# Patient Record
Sex: Female | Born: 1990 | Race: Black or African American | Hispanic: No | Marital: Single | Smoking: Never smoker
Health system: Southern US, Community
[De-identification: ages and names within clinical notes are randomized; demographics above are authoritative.]

## PROBLEM LIST (undated history)

## (undated) ENCOUNTER — Inpatient Hospital Stay (HOSPITAL_COMMUNITY): Payer: Self-pay

## (undated) DIAGNOSIS — Z789 Other specified health status: Secondary | ICD-10-CM

## (undated) DIAGNOSIS — Z8759 Personal history of other complications of pregnancy, childbirth and the puerperium: Secondary | ICD-10-CM

## (undated) HISTORY — PX: WISDOM TOOTH EXTRACTION: SHX21

---

## 2011-01-30 DIAGNOSIS — Z8759 Personal history of other complications of pregnancy, childbirth and the puerperium: Secondary | ICD-10-CM

## 2011-01-30 HISTORY — DX: Personal history of other complications of pregnancy, childbirth and the puerperium: Z87.59

## 2011-01-30 NOTE — L&D Delivery Note (Addendum)
Delivery Note The patient progressed to complete dilation and pushed well.  At 5:09 AM a non-viable female was delivered via Vaginal, Spontaneous Delivery (Presentation: Right Occiput Anterior).  APGAR:0 ,0 ; weight pending   Placenta status: Intact, Spontaneous.  Cord:  with the following complications: None. Placenta was delivered and inspected with no obvious abnormalities, cord appeared normal except blood was congealed in it.  Anesthesia: Epidural  Episiotomy: None Lacerations: nnone Suture Repair: n/a Est. Blood Loss (mL): 300  Mom to postpartum.  Baby to pathology. Emotional support given to family over loss.  D/w her no obvious cause for stillbirth at delivery.  D/w pt process of autopsy should she want one.   Will recheck platelets prior to removing epidural.  Also will get TORCH titres and antiphospholipid labs.  Oliver Pila 08/11/2011, 5:52 AM

## 2011-02-27 LAB — OB RESULTS CONSOLE ABO/RH: RH Type: POSITIVE

## 2011-02-27 LAB — OB RESULTS CONSOLE ANTIBODY SCREEN: Antibody Screen: NEGATIVE

## 2011-02-27 LAB — OB RESULTS CONSOLE HIV ANTIBODY (ROUTINE TESTING): HIV: NONREACTIVE

## 2011-06-18 LAB — OB RESULTS CONSOLE RPR: RPR: NONREACTIVE

## 2011-08-02 ENCOUNTER — Encounter (HOSPITAL_COMMUNITY): Payer: Self-pay

## 2011-08-02 ENCOUNTER — Inpatient Hospital Stay (HOSPITAL_COMMUNITY)
Admission: AD | Admit: 2011-08-02 | Discharge: 2011-08-02 | Disposition: A | Discharge status: Home / Self Care | Payer: Medicaid Other | Source: Ambulatory Visit | Attending: Obstetrics and Gynecology | Admitting: Obstetrics and Gynecology

## 2011-08-02 DIAGNOSIS — O26899 Other specified pregnancy related conditions, unspecified trimester: Secondary | ICD-10-CM

## 2011-08-02 DIAGNOSIS — O99891 Other specified diseases and conditions complicating pregnancy: Secondary | ICD-10-CM | POA: Insufficient documentation

## 2011-08-02 DIAGNOSIS — N898 Other specified noninflammatory disorders of vagina: Secondary | ICD-10-CM

## 2011-08-02 HISTORY — DX: Other specified health status: Z78.9

## 2011-08-02 LAB — WET PREP, GENITAL
Clue Cells Wet Prep HPF POC: NONE SEEN
Trich, Wet Prep: NONE SEEN
Yeast Wet Prep HPF POC: NONE SEEN

## 2011-08-02 LAB — URINALYSIS, ROUTINE W REFLEX MICROSCOPIC
Bilirubin Urine: NEGATIVE
Hgb urine dipstick: NEGATIVE
Ketones, ur: NEGATIVE mg/dL
Protein, ur: NEGATIVE mg/dL
Specific Gravity, Urine: 1.02 (ref 1.005–1.030)
Urobilinogen, UA: 0.2 mg/dL (ref 0.0–1.0)

## 2011-08-02 LAB — AMNISURE RUPTURE OF MEMBRANE (ROM) NOT AT ARMC: Amnisure ROM: NEGATIVE

## 2011-08-02 NOTE — MAU Note (Signed)
Pt states was last seen by Dr. Ambrose Mantle, told her baby was measuring small for dating. Was seen yesterday. Notes clear watery vaginal discharge x1-2 weeks. Denies bleeding.

## 2011-08-02 NOTE — MAU Note (Signed)
Patient states she has been having a thick discharge for a couple of weeks but has not become watery. Reports fetal movement, not as much as usual, no bleeding. Some mild tightening off and on.

## 2011-08-02 NOTE — MAU Provider Note (Signed)
  History     CSN: [id number]  Arrival date and time: 08/02/11 1517   First Provider Initiated Contact with Patient 08/02/11 1600      Chief Complaint  Patient presents with  . Rupture of Membranes   HPI Kayla Moon 21 y.o. is a G1P0 [redacted]w[redacted]d.  She has  had vaginal discharge off/on x 2-3 wks, sometimes is thin , some time is not. Is concerned may have ROM. No bleeding, good fetal movement. Occ cramping.     Past Medical History  Diagnosis Date  . No pertinent past medical history     Past Surgical History  Procedure Date  . Wisdom tooth extraction     Family History  Problem Relation Age of Onset  . Other Neg Hx     History  Substance Use Topics  . Smoking status: Never Smoker   . Smokeless tobacco: Never Used  . Alcohol Use: No    Allergies: No Known Allergies  Prescriptions prior to admission  Medication Sig Dispense Refill  . Prenatal Vit-Fe Fumarate-FA (PRENATAL MULTIVITAMIN) TABS Take 1 tablet by mouth daily.        Review of Systems  Constitutional: Negative for fever and chills.  Genitourinary: Negative for dysuria, urgency and frequency.       Vaginal discharge, mild cramping   Physical Exam   Blood pressure 114/59, pulse 79, temperature 99 F (37.2 C), temperature source Oral, resp. rate 16, height 5\' 4"  (1.626 m), weight 169 lb 3.2 oz (76.749 kg), SpO2 98.00%.  Physical Exam  Constitutional: She is oriented to person, place, and time. She appears well-developed and well-nourished.  GI: Soft. There is no tenderness. There is no guarding.  Genitourinary:       Vulva- nl anatomy Vagina- small amt thick white and mucoid discharge Cx- posterior, thick, firm, closed Uterus- gravid   Musculoskeletal: Normal range of motion.  Neurological: She is alert and oriented to person, place, and time.  Skin: Skin is warm and dry.  Psychiatric: She has a normal mood and affect. Her behavior is normal.    MAU Course  Procedures  MDM Results for orders  placed during the hospital encounter of 08/02/11 (from the past 24 hour(s))  WET PREP, GENITAL     Status: Abnormal   Collection Time   08/02/11  4:09 PM      Component Value Range   Yeast Wet Prep HPF POC NONE SEEN  NONE SEEN   Trich, Wet Prep NONE SEEN  NONE SEEN   Clue Cells Wet Prep HPF POC NONE SEEN  NONE SEEN   WBC, Wet Prep HPF POC FEW (*) NONE SEEN  AMNISURE RUPTURE OF MEMBRANE (ROM)     Status: Normal   Collection Time   08/02/11  4:09 PM      Component Value Range   Amnisure ROM NEGATIVE     Consulted with Dr Senaida Ores  Assessment and Plan  Fern negative, Amniosure negative Cx closed, thick  Reactive strip.   D/C home Keep next appt in office Precautions reviewed  Rodell Perna. 08/02/2011, 5:28 PM

## 2011-08-03 ENCOUNTER — Other Ambulatory Visit (HOSPITAL_COMMUNITY): Payer: Self-pay | Admitting: Obstetrics and Gynecology

## 2011-08-03 DIAGNOSIS — Z0489 Encounter for examination and observation for other specified reasons: Secondary | ICD-10-CM

## 2011-08-06 ENCOUNTER — Ambulatory Visit (HOSPITAL_COMMUNITY): Admission: RE | Admit: 2011-08-06 | Payer: Self-pay | Source: Ambulatory Visit

## 2011-08-07 ENCOUNTER — Ambulatory Visit (HOSPITAL_COMMUNITY)
Admission: RE | Admit: 2011-08-07 | Discharge: 2011-08-07 | Disposition: A | Discharge status: Home / Self Care | Payer: Medicaid Other | Source: Ambulatory Visit | Attending: Obstetrics and Gynecology | Admitting: Obstetrics and Gynecology

## 2011-08-07 DIAGNOSIS — Z3689 Encounter for other specified antenatal screening: Secondary | ICD-10-CM | POA: Insufficient documentation

## 2011-08-07 DIAGNOSIS — Z0489 Encounter for examination and observation for other specified reasons: Secondary | ICD-10-CM

## 2011-08-07 DIAGNOSIS — O365990 Maternal care for other known or suspected poor fetal growth, unspecified trimester, not applicable or unspecified: Secondary | ICD-10-CM | POA: Insufficient documentation

## 2011-08-10 ENCOUNTER — Inpatient Hospital Stay (HOSPITAL_COMMUNITY)
Admission: AD | Admit: 2011-08-10 | Discharge: 2011-08-12 | DRG: 775 | Disposition: A | Discharge status: Home / Self Care | Payer: Medicaid Other | Source: Ambulatory Visit | Attending: Obstetrics and Gynecology | Admitting: Obstetrics and Gynecology

## 2011-08-10 DIAGNOSIS — D696 Thrombocytopenia, unspecified: Secondary | ICD-10-CM | POA: Diagnosis present

## 2011-08-10 DIAGNOSIS — O9912 Other diseases of the blood and blood-forming organs and certain disorders involving the immune mechanism complicating childbirth: Secondary | ICD-10-CM | POA: Diagnosis present

## 2011-08-10 DIAGNOSIS — D689 Coagulation defect, unspecified: Secondary | ICD-10-CM | POA: Diagnosis present

## 2011-08-10 DIAGNOSIS — O364XX0 Maternal care for intrauterine death, not applicable or unspecified: Principal | ICD-10-CM | POA: Diagnosis present

## 2011-08-10 DIAGNOSIS — IMO0002 Reserved for concepts with insufficient information to code with codable children: Secondary | ICD-10-CM

## 2011-08-11 ENCOUNTER — Encounter (HOSPITAL_COMMUNITY): Payer: Self-pay

## 2011-08-11 ENCOUNTER — Inpatient Hospital Stay (HOSPITAL_COMMUNITY): Payer: Medicaid Other

## 2011-08-11 ENCOUNTER — Encounter (HOSPITAL_COMMUNITY): Payer: Self-pay | Admitting: Anesthesiology

## 2011-08-11 ENCOUNTER — Inpatient Hospital Stay (HOSPITAL_COMMUNITY): Payer: Medicaid Other | Admitting: Anesthesiology

## 2011-08-11 LAB — TYPE AND SCREEN
ABO/RH(D): O POS
Antibody Screen: NEGATIVE

## 2011-08-11 LAB — CBC
HCT: 38.5 % (ref 36.0–46.0)
Hemoglobin: 12 g/dL (ref 12.0–15.0)
Hemoglobin: 12.4 g/dL (ref 12.0–15.0)
MCH: 27.5 pg (ref 26.0–34.0)
MCHC: 32.5 g/dL (ref 30.0–36.0)
Platelets: 101 10*3/uL — ABNORMAL LOW (ref 150–400)
RBC: 4.54 MIL/uL (ref 3.87–5.11)
WBC: 12.9 10*3/uL — ABNORMAL HIGH (ref 4.0–10.5)

## 2011-08-11 LAB — COMPREHENSIVE METABOLIC PANEL
AST: 16 U/L (ref 0–37)
CO2: 21 mEq/L (ref 19–32)
Calcium: 9.8 mg/dL (ref 8.4–10.5)
Creatinine, Ser: 0.62 mg/dL (ref 0.50–1.10)
GFR calc non Af Amer: >90 mL/min (ref >90)

## 2011-08-11 LAB — RPR: RPR Ser Ql: NONREACTIVE

## 2011-08-11 MED ORDER — MISOPROSTOL 200 MCG PO TABS
ORAL_TABLET | ORAL | Status: AC
  Filled 2011-08-11: qty 6

## 2011-08-11 MED ORDER — PHENYLEPHRINE 40 MCG/ML (10ML) SYRINGE FOR IV PUSH (FOR BLOOD PRESSURE SUPPORT)
80.0000 ug | PREFILLED_SYRINGE | INTRAVENOUS | Status: DC | PRN
  Filled 2011-08-11: qty 5

## 2011-08-11 MED ORDER — WITCH HAZEL-GLYCERIN EX PADS: 1.0000 "application " | MEDICATED_PAD | CUTANEOUS | Status: DC | PRN

## 2011-08-11 MED ORDER — OXYTOCIN BOLUS FROM INFUSION
250.0000 mL | Freq: Once | INTRAVENOUS | Status: DC
  Filled 2011-08-11: qty 500

## 2011-08-11 MED ORDER — ONDANSETRON HCL 4 MG/2ML IJ SOLN: 4.0000 mg | INTRAMUSCULAR | Status: DC | PRN

## 2011-08-11 MED ORDER — TETANUS-DIPHTH-ACELL PERTUSSIS 5-2.5-18.5 LF-MCG/0.5 IM SUSP: 0.5000 mL | Freq: Once | INTRAMUSCULAR | Status: DC

## 2011-08-11 MED ORDER — ACETAMINOPHEN 325 MG PO TABS: 650.0000 mg | ORAL_TABLET | ORAL | Status: DC | PRN

## 2011-08-11 MED ORDER — ONDANSETRON HCL 4 MG PO TABS: 4.0000 mg | ORAL_TABLET | ORAL | Status: DC | PRN

## 2011-08-11 MED ORDER — CITRIC ACID-SODIUM CITRATE 334-500 MG/5ML PO SOLN: 30.0000 mL | ORAL | Status: DC | PRN

## 2011-08-11 MED ORDER — LIDOCAINE HCL (PF) 1 % IJ SOLN: 30.0000 mL | INTRAMUSCULAR | Status: DC | PRN

## 2011-08-11 MED ORDER — SIMETHICONE 80 MG PO CHEW
80.0000 mg | CHEWABLE_TABLET | ORAL | Status: DC | PRN
Start: 2011-08-11 — End: 2011-08-12

## 2011-08-11 MED ORDER — IBUPROFEN 600 MG PO TABS: 600.0000 mg | ORAL_TABLET | Freq: Four times a day (QID) | ORAL | Status: AC

## 2011-08-11 MED ORDER — DIPHENHYDRAMINE HCL 50 MG/ML IJ SOLN: 12.5000 mg | INTRAMUSCULAR | Status: DC | PRN

## 2011-08-11 MED ORDER — DIBUCAINE 1 % RE OINT: 1.0000 "application " | TOPICAL_OINTMENT | RECTAL | Status: DC | PRN

## 2011-08-11 MED ORDER — ONDANSETRON HCL 4 MG/2ML IJ SOLN: 4.0000 mg | Freq: Four times a day (QID) | INTRAMUSCULAR | Status: DC | PRN

## 2011-08-11 MED ORDER — FENTANYL 2.5 MCG/ML BUPIVACAINE 1/10 % EPIDURAL INFUSION (WH - ANES)
14.0000 mL/h | INTRAMUSCULAR | Status: DC
  Administered 2011-08-11: 14 mL/h via EPIDURAL
  Filled 2011-08-11: qty 60

## 2011-08-11 MED ORDER — TERBUTALINE SULFATE 1 MG/ML IJ SOLN: 0.2500 mg | Freq: Once | INTRAMUSCULAR | Status: DC | PRN

## 2011-08-11 MED ORDER — OXYCODONE-ACETAMINOPHEN 5-325 MG PO TABS: 1.0000 | ORAL_TABLET | ORAL | Status: DC | PRN

## 2011-08-11 MED ORDER — BENZOCAINE-MENTHOL 20-0.5 % EX AERO: 1.0000 "application " | INHALATION_SPRAY | CUTANEOUS | Status: DC | PRN

## 2011-08-11 MED ORDER — LIDOCAINE HCL (PF) 1 % IJ SOLN
INTRAMUSCULAR | Status: DC | PRN
  Administered 2011-08-11 (×2): 5 mL

## 2011-08-11 MED ORDER — EPHEDRINE 5 MG/ML INJ: 10.0000 mg | INTRAVENOUS | Status: DC | PRN

## 2011-08-11 MED ORDER — OXYCODONE-ACETAMINOPHEN 5-325 MG PO TABS: 1.0000 | ORAL_TABLET | ORAL | Status: AC | PRN

## 2011-08-11 MED ORDER — ZOLPIDEM TARTRATE 5 MG PO TABS: 5.0000 mg | ORAL_TABLET | Freq: Every evening | ORAL | Status: DC | PRN

## 2011-08-11 MED ORDER — EPHEDRINE 5 MG/ML INJ
10.0000 mg | INTRAVENOUS | Status: DC | PRN
  Filled 2011-08-11: qty 4

## 2011-08-11 MED ORDER — OXYTOCIN 40 UNITS IN LACTATED RINGERS INFUSION - SIMPLE MED
62.5000 mL/h | Freq: Once | INTRAVENOUS | Status: AC
  Administered 2011-08-11: 62.5 mL/h via INTRAVENOUS

## 2011-08-11 MED ORDER — FLEET ENEMA 7-19 GM/118ML RE ENEM: 1.0000 | ENEMA | RECTAL | Status: DC | PRN

## 2011-08-11 MED ORDER — OXYTOCIN 40 UNITS IN LACTATED RINGERS INFUSION - SIMPLE MED
1.0000 m[IU]/min | INTRAVENOUS | Status: DC
  Administered 2011-08-11: 2 m[IU]/min via INTRAVENOUS
  Filled 2011-08-11: qty 1000

## 2011-08-11 MED ORDER — LACTATED RINGERS IV SOLN: 500.0000 mL | INTRAVENOUS | Status: DC | PRN

## 2011-08-11 MED ORDER — IBUPROFEN 600 MG PO TABS
600.0000 mg | ORAL_TABLET | Freq: Four times a day (QID) | ORAL | Status: DC
  Administered 2011-08-11 (×2): 600 mg via ORAL
  Filled 2011-08-11 (×3): qty 1

## 2011-08-11 MED ORDER — LACTATED RINGERS IV SOLN
INTRAVENOUS | Status: DC
  Administered 2011-08-11: 02:00:00 via INTRAVENOUS

## 2011-08-11 MED ORDER — PRENATAL MULTIVITAMIN CH: 1.0000 | ORAL_TABLET | Freq: Every day | ORAL | Status: DC

## 2011-08-11 MED ORDER — OXYCODONE-ACETAMINOPHEN 5-325 MG PO TABS
1.0000 | ORAL_TABLET | ORAL | Status: DC | PRN
  Administered 2011-08-11 – 2011-08-12 (×3): 1 via ORAL
  Filled 2011-08-11: qty 2
  Filled 2011-08-11 (×2): qty 1

## 2011-08-11 MED ORDER — IBUPROFEN 600 MG PO TABS: 600.0000 mg | ORAL_TABLET | Freq: Four times a day (QID) | ORAL | Status: DC | PRN

## 2011-08-11 MED ORDER — METHYLERGONOVINE MALEATE 0.2 MG/ML IJ SOLN
INTRAMUSCULAR | Status: AC
  Filled 2011-08-11: qty 1

## 2011-08-11 MED ORDER — PHENYLEPHRINE 40 MCG/ML (10ML) SYRINGE FOR IV PUSH (FOR BLOOD PRESSURE SUPPORT): 80.0000 ug | PREFILLED_SYRINGE | INTRAVENOUS | Status: DC | PRN

## 2011-08-11 MED ORDER — LANOLIN HYDROUS EX OINT: TOPICAL_OINTMENT | CUTANEOUS | Status: DC | PRN

## 2011-08-11 MED ORDER — DIPHENHYDRAMINE HCL 25 MG PO CAPS: 25.0000 mg | ORAL_CAPSULE | Freq: Four times a day (QID) | ORAL | Status: DC | PRN

## 2011-08-11 MED ORDER — LACTATED RINGERS IV SOLN: 500.0000 mL | Freq: Once | INTRAVENOUS | Status: DC

## 2011-08-11 MED ORDER — SENNOSIDES-DOCUSATE SODIUM 8.6-50 MG PO TABS
2.0000 | ORAL_TABLET | Freq: Every day | ORAL | Status: DC
  Administered 2011-08-11: 2 via ORAL

## 2011-08-11 NOTE — Progress Notes (Signed)
Patient ID: Kayla Moon, female   DOB: 01-Apr-1990, 21 y.o.   MRN: [id number] Pt now comfortable with epidural  Ctx  q 5 minutes AROM clear, no blood noted or foul smell to fluid 80/7/-1  IUPC and foley placed Platelets were low normal at 101K but BP WNL and CMET WNL so suspect gestational thrombocytopenia. Will monitor progress and augment if needed, but making good progress. Baby OP currently. Pt just had [place] 7/9 that showed normal growth and AFI and no anatomical issues.

## 2011-08-11 NOTE — Discharge Summary (Addendum)
Obstetric Discharge Summary Reason for Admission: IUFD with labor Prenatal Procedures: ultrasound Intrapartum Procedures: spontaneous vaginal delivery Postpartum Procedures: TORCH titres and labs Complications-Operative and Postpartum: none Hemoglobin  Date Value Range Status  08/11/2011 12.4  12.0 - 15.0 g/dL Final     HCT  Date Value Range Status  08/11/2011 38.5  36.0 - 46.0 % Final    Physical Exam:  General: alert and cooperative, grieving appropriately Lochia: appropriate Uterine Fundus: firm   Discharge Diagnoses: Intrauterine demise at 35+weeks of unknown etiology--delivered  Discharge Information: Date: 08/11/2011 Activity: pelvic rest Diet: routine Medications: Ibuprofen and Percocet Condition: improved Instructions: refer to practice specific booklet Discharge to: home   Newborn Data: Live born female  Birth Weight: 6 lb 7 oz (2920 g) APGAR :0 , 0    Kayla Moon W 08/11/2011, 8:32 AM  Patient decided to stay an additional night to sleep. Was d/c on postpartum day 1.

## 2011-08-11 NOTE — Anesthesia Procedure Notes (Signed)
Epidural Patient location during procedure: OB Start time: 08/11/2011 2:02 AM  Staffing Anesthesiologist: Brayton Caves R Performed by: anesthesiologist   Preanesthetic Checklist Completed: patient identified, site marked, surgical consent, pre-op evaluation, timeout performed, IV checked, risks and benefits discussed and monitors and equipment checked  Epidural Patient position: sitting Prep: site prepped and draped and DuraPrep Patient monitoring: continuous pulse ox and blood pressure Approach: midline Injection technique: LOR air and LOR saline  Needle:  Needle type: Tuohy  Needle gauge: 17 G Needle length: 9 cm Needle insertion depth: 5 cm cm Catheter type: closed end flexible Catheter size: 19 Gauge Catheter at skin depth: 10 cm Test dose: negative  Assessment Events: blood not aspirated, injection not painful, no injection resistance, negative IV test and no paresthesia  Additional Notes Patient identified.  Risk benefits discussed including failed block, incomplete pain control, headache, nerve damage, paralysis, blood pressure changes, nausea, vomiting, reactions to medication both toxic or allergic, and postpartum back pain.  Patient expressed understanding and wished to proceed.  All questions were answered.  Sterile technique used throughout procedure and epidural site dressed with sterile barrier dressing. No paresthesia or other complications noted.The patient did not experience any signs of intravascular injection such as tinnitus or metallic taste in mouth nor signs of intrathecal spread such as rapid motor block. Please see nursing notes for vital signs.

## 2011-08-11 NOTE — Progress Notes (Signed)
Called susan lineberry, np stat for a bedside u/s. Unable to attain fetal heart tone

## 2011-08-11 NOTE — Clinical Social Work Note (Signed)
SW received referral from unit RN, Selena Batten, due to fetal demise at 35 weeks with no known etiology.  SW offered condolences and referred family to [facility] for bereavement counseling.  FOB was still holding baby in the room.  MOB appeared to be grieving appropriately.  She displayed a sad affect and did not wish to discuss further.  Encouraged MOB to contact SW should she need further counseling while in the hospital. Louie Boston, LCSW

## 2011-08-11 NOTE — Anesthesia Preprocedure Evaluation (Signed)
Anesthesia Evaluation  Patient identified by MRN, date of birth, ID band Patient awake    Reviewed: Allergy & Precautions, H&P , Patient's Chart, lab work & pertinent test results  Airway Mallampati: II TM Distance: >3 FB Neck ROM: full    Dental No notable dental hx.    Pulmonary neg pulmonary ROS,  breath sounds clear to auscultation  Pulmonary exam normal       Cardiovascular negative cardio ROS  Rhythm:regular Rate:Normal     Neuro/Psych negative neurological ROS  negative psych ROS   GI/Hepatic negative GI ROS, Neg liver ROS,   Endo/Other  negative endocrine ROS  Renal/GU negative Renal ROS     Musculoskeletal   Abdominal   Peds  Hematology negative hematology ROS (+)   Anesthesia Other Findings IUFD  Reproductive/Obstetrics (+) Pregnancy                           Anesthesia Physical Anesthesia Plan  ASA: II  Anesthesia Plan: Epidural   Post-op Pain Management:    Induction:   Airway Management Planned:   Additional Equipment:   Intra-op Plan:   Post-operative Plan:   Informed Consent: I have reviewed the patients History and Physical, chart, labs and discussed the procedure including the risks, benefits and alternatives for the proposed anesthesia with the patient or authorized representative who has indicated his/her understanding and acceptance.     Plan Discussed with:   Anesthesia Plan Comments:         Anesthesia Quick Evaluation

## 2011-08-11 NOTE — H&P (Signed)
Kayla Moon is a 21 y.o. female G1P0 at 28 3/7 weeks (EDD 09/12/11 by 11 week [place]) presenting for painful regular contractions with the unfortunate finding of a fetal demise on arrival.  Confirmed by [place], vtx and no obvious placental abnormalities. Prenatal care completely uneventful.  Maternal Medical History:  Reason for admission: Reason for admission: contractions.  Contractions: Onset was 6-12 hours ago.   Frequency: regular.   Perceived severity is strong.    Fetal activity: Perceived fetal activity is none.      OB History    Grav Para Term Preterm Abortions TAB SAB Ect Mult Living   1              Past Medical History  Diagnosis Date  . No pertinent past medical history    Past Surgical History  Procedure Date  . Wisdom tooth extraction    Family History: family history is negative for Other. Social History:  reports that she has never smoked. She has never used smokeless tobacco. She reports that she does not drink alcohol or use illicit drugs.   Prenatal Transfer Tool  Maternal Diabetes: No Genetic Screening: Normal First trimester screen and AFP WNL Maternal Ultrasounds/Referrals: Normal Fetal Ultrasounds or other Referrals:  None Maternal Substance Abuse:  No Significant Maternal Medications:  None Significant Maternal Lab Results:  None Other Comments:  None  ROS  Dilation: 4 Effacement (%): 90 Station: -2 Exam by:: Peace, rn Blood pressure 125/72, pulse 78, resp. rate 20, SpO2 100.00%. Maternal Exam:  Uterine Assessment: Contraction strength is firm.  Contraction frequency is regular.   Abdomen: Patient reports no abdominal tenderness. Fetal presentation: vertex  Introitus: Normal vulva. Normal vagina.    Physical Exam  Constitutional: She is oriented to person, place, and time. She appears well-developed and well-nourished.  Cardiovascular: Normal rate and regular rhythm.   Respiratory: Effort normal and breath sounds normal.  GI: Soft. Bowel  sounds are normal.  Genitourinary: Vagina normal and uterus normal.       4/90/-1 per RN exam  Musculoskeletal: Normal range of motion.  Neurological: She is alert and oriented to person, place, and time.  Psychiatric: Her behavior is normal.    Prenatal labs: ABO, Rh:  O positive  Antibody:  Negative Rubella:  Immune RPR:   NR HBsAg:   Neg HIV:   NR GBS:   unknown One hour GTT 96 First trimester screen and AFP WNL Assessment/Plan: Pt with IUFD at 35+ weeks confirmed by [place].  Unremarkable prenatal care with no obvious etiology.  Pt uncomfortable with contractions, will admit and get epidural.  Briefly d/w pt autopsy if no obvious cause present at delivery.  Oliver Pila 08/11/2011, 1:23 AM

## 2011-08-11 NOTE — Progress Notes (Signed)
Report given of confirmed IUFD by ultrasound.

## 2011-08-11 NOTE — MAU Note (Signed)
Patient is in with c/o ctx all day that got worse since 2200pm. She denies any vaginal bleeding, or lof. Reports decreased fetal movement.

## 2011-08-11 NOTE — Progress Notes (Signed)
Patient states that she have been having contraction intermittently all day. She states that the last active movement from baby was 10.30am. She states that she started more frequent contractions in the afternoon and that was the last time she felt any fetal movement. She states that about 2200, the contractions are more intense (about q41mins ) and and she had pressure.

## 2011-08-11 NOTE — Progress Notes (Signed)
Patient ID: Kayla Moon, female   DOB: 1990/09/13, 21 y.o.   MRN: [id number] Pt in postpartum room and holding baby She would like to go home this pm if stable  Fundus firm  Plts are stable at 120K D/w pt normal grieving, Heart Strings and what to expect Will d/c home this pm with motrin and percocet

## 2011-08-11 NOTE — Anesthesia Postprocedure Evaluation (Signed)
  Anesthesia Post-op Note  Patient: Kayla Moon  Procedure(s) Performed: * No procedures listed *  Patient Location: PACU and Women's Unit  Anesthesia Type: Epidural  Level of Consciousness: awake, alert  and oriented  Airway and Oxygen Therapy: Patient Spontanous Breathing    Post-op Assessment: Patient's Cardiovascular Status Stable and Respiratory Function Stable  Post-op Vital Signs: stable  Complications: No apparent anesthesia complications

## 2011-08-11 NOTE — MAU Provider Note (Signed)
  History     CSN: [id number]  Arrival date and time: 08/10/11 2358   None     Chief Complaint  Patient presents with  . Contractions   HPI  Pt is [redacted]w[redacted]d pregnant and presents with contractions and decreased fetal movement  Past Medical History  Diagnosis Date  . No pertinent past medical history     Past Surgical History  Procedure Date  . Wisdom tooth extraction     Family History  Problem Relation Age of Onset  . Other Neg Hx     History  Substance Use Topics  . Smoking status: Never Smoker   . Smokeless tobacco: Never Used  . Alcohol Use: No    Allergies: No Known Allergies  Prescriptions prior to admission  Medication Sig Dispense Refill  . Prenatal Vit-Fe Fumarate-FA (PRENATAL MULTIVITAMIN) TABS Take 1 tablet by mouth daily.        ROS Physical Exam   There were no vitals taken for this visit.  Physical Exam  MAU Course  Procedures Doppler fetal heart tones not audible- informal bedside  Showed no fetal movement- formal bedside ultrasound ordered Dr. Senaida Ores notified Radiologist called to confirm no cardiac activity Assessment and Plan    Trevaris Pennella 08/11/2011, 12:38 AM

## 2011-08-12 LAB — CBC
Hemoglobin: 11.5 g/dL — ABNORMAL LOW (ref 12.0–15.0)
MCH: 28.3 pg (ref 26.0–34.0)
MCHC: 33.1 g/dL (ref 30.0–36.0)
MCV: 85.5 fL (ref 78.0–100.0)
RBC: 4.06 MIL/uL (ref 3.87–5.11)

## 2011-08-12 LAB — TOXOPLASMA GONDII ANTIBODY, IGG: Toxoplasma IgG Ratio: <0.5 IU/mL (ref <6.4)

## 2011-08-12 NOTE — Progress Notes (Signed)
Discharge instructions reviewed with patient and significant other.  Both verbally able to teach back home care and signs/symptoms to report to MD.  No home equipment needed.  Ambulated to car with staff without incident and discharged to care of significant other.

## 2011-08-12 NOTE — Progress Notes (Signed)
Patient ID: Kayla Moon, female   DOB: 12/27/1990, 21 y.o.   MRN: [id number] Pt stayed overnight per her request and got some good rest. We will d/c home today with meds as previously planned. Spent a good deal of time talking about normal grieving and they feel they have great support with their families.

## 2011-08-13 LAB — LUPUS ANTICOAGULANT PANEL: PTT Lupus Anticoagulant: 34.3 secs (ref 28.0–43.0)

## 2011-08-13 LAB — CARDIOLIPIN ANTIBODIES, IGG, IGM, IGA
Anticardiolipin IgA: 5 APL U/mL — ABNORMAL LOW (ref <22)
Anticardiolipin IgM: 3 MPL U/mL — ABNORMAL LOW (ref <11)

## 2011-08-13 LAB — WOUND CULTURE: Culture: NO GROWTH

## 2011-08-13 LAB — HSV 1 ANTIBODY, IGG: HSV 1 Glycoprotein G Ab, IgG: 5.46 IV — ABNORMAL HIGH

## 2011-08-14 LAB — CMV ANTIBODY, IGG (EIA): CMV Ab - IgG: 6.36 — ABNORMAL HIGH (ref <0.90)

## 2011-08-15 ENCOUNTER — Encounter (HOSPITAL_COMMUNITY): Payer: Self-pay | Admitting: *Deleted

## 2011-08-16 LAB — ANAEROBIC CULTURE: Gram Stain: NONE SEEN

## 2011-08-17 LAB — TORCH-IGM(TOXO/ RUB/ CMV/ HSV) W TITER
CMV IgM: <0.2 AI
HSV 1 IgM Abs: NEGATIVE
RPR Screen: NONREACTIVE
Rubella IgM Index: <0.9 Ratio (ref <0.90)
Toxoplasma IgM: NEGATIVE

## 2011-08-23 NOTE — Progress Notes (Signed)
Post discharge UR review completed. 

## 2011-11-02 ENCOUNTER — Emergency Department (HOSPITAL_BASED_OUTPATIENT_CLINIC_OR_DEPARTMENT_OTHER)
Admission: EM | Admit: 2011-11-02 | Discharge: 2011-11-02 | Disposition: A | Discharge status: Home / Self Care | Payer: Medicaid Other | Attending: Emergency Medicine | Admitting: Emergency Medicine

## 2011-11-02 ENCOUNTER — Encounter (HOSPITAL_BASED_OUTPATIENT_CLINIC_OR_DEPARTMENT_OTHER): Payer: Self-pay

## 2011-11-02 DIAGNOSIS — M436 Torticollis: Secondary | ICD-10-CM | POA: Insufficient documentation

## 2011-11-02 MED ORDER — CYCLOBENZAPRINE HCL 10 MG PO TABS
10.0000 mg | ORAL_TABLET | Freq: Once | ORAL | Status: AC
  Administered 2011-11-02: 10 mg via ORAL
  Filled 2011-11-02: qty 1

## 2011-11-02 MED ORDER — HYDROCODONE-ACETAMINOPHEN 5-325 MG PO TABS
1.0000 | ORAL_TABLET | Freq: Once | ORAL | Status: AC
  Administered 2011-11-02: 1 via ORAL
  Filled 2011-11-02: qty 1

## 2011-11-02 MED ORDER — HYDROCODONE-ACETAMINOPHEN 5-325 MG PO TABS: 2.0000 | ORAL_TABLET | ORAL | Status: AC | PRN

## 2011-11-02 MED ORDER — CYCLOBENZAPRINE HCL 10 MG PO TABS: 10.0000 mg | ORAL_TABLET | Freq: Two times a day (BID) | ORAL | Status: DC | PRN

## 2011-11-02 NOTE — ED Provider Notes (Signed)
History     CSN: [id number]  Arrival date & time 11/02/11  1407   First MD Initiated Contact with Patient 11/02/11 1442      Chief Complaint  Patient presents with  . Neck Pain    (Consider location/radiation/quality/duration/timing/severity/associated sxs/prior treatment) HPI  Past Medical History  Diagnosis Date  . No pertinent past medical history     Past Surgical History  Procedure Date  . Wisdom tooth extraction     Family History  Problem Relation Age of Onset  . Other Neg Hx     History  Substance Use Topics  . Smoking status: Never Smoker   . Smokeless tobacco: Never Used  . Alcohol Use: No    OB History    Grav Para Term Preterm Abortions TAB SAB Ect Mult Living   1 1  1             Review of Systems  Allergies  Review of patient's allergies indicates no known allergies.  Home Medications   Current Outpatient Rx  Name Route Sig Dispense Refill  . CYCLOBENZAPRINE HCL 10 MG PO TABS Oral Take 1 tablet (10 mg total) by mouth 2 (two) times daily as needed for muscle spasms. 20 tablet 0  . HYDROCODONE-ACETAMINOPHEN 5-325 MG PO TABS Oral Take 2 tablets by mouth every 4 (four) hours as needed for pain. 16 tablet 0  . PRENATAL MULTIVITAMIN CH Oral Take 1 tablet by mouth daily.      BP 125/57  Pulse 83  Temp 98.6 F (37 C) (Oral)  Resp 20  Ht 5\' 6"  (1.676 m)  Wt 150 lb (68.04 kg)  BMI 24.21 kg/m2  SpO2 99%  LMP 09/21/2011  Physical Exam  ED Course  Procedures (including critical care time)  Labs Reviewed - No data to display No results found.   1. Torticollis, acute       MDM  Pt reports her neck feels better,  Increased range of motion,   Pt given rx for hydrocodone and flexeril        Elson Areas, [place] 11/02/11 1631

## 2011-11-02 NOTE — ED Notes (Signed)
Discharge instructions and medications reviewed. Pt verbalized understanding.  

## 2011-11-02 NOTE — ED Notes (Signed)
Pt also now  states she had positive home preg test

## 2011-11-02 NOTE — ED Provider Notes (Addendum)
History     CSN: [id number]  Arrival date & time 11/02/11  1407   First MD Initiated Contact with Patient 11/02/11 1442      Chief Complaint  Patient presents with  . Neck Pain    (Consider location/radiation/quality/duration/timing/severity/associated sxs/prior treatment) Patient is a 21 y.o. female presenting with neck pain. The history is provided by the patient.  Neck Pain  This is a new problem. The current episode started 3 to 5 hours ago. The problem occurs constantly. The problem has been gradually worsening. The pain is associated with nothing. There has been no fever. The pain is present in the left side. The quality of the pain is described as stabbing and shooting. The pain radiates to the left shoulder. The pain is at a severity of 9/10. The pain is severe. The symptoms are aggravated by twisting and position. The pain is the same all the time. Stiffness is present all day. Pertinent negatives include no numbness, no headaches, no leg pain, no paresis, no tingling and no weakness. She has tried neck support for the symptoms. The treatment provided no relief.    Past Medical History  Diagnosis Date  . No pertinent past medical history     Past Surgical History  Procedure Date  . Wisdom tooth extraction     Family History  Problem Relation Age of Onset  . Other Neg Hx     History  Substance Use Topics  . Smoking status: Never Smoker   . Smokeless tobacco: Never Used  . Alcohol Use: No    OB History    Grav Para Term Preterm Abortions TAB SAB Ect Mult Living   1 1  1             Review of Systems  HENT: Positive for neck pain.   Neurological: Negative for tingling, weakness, numbness and headaches.  All other systems reviewed and are negative.    Allergies  Review of patient's allergies indicates no known allergies.  Home Medications   Current Outpatient Rx  Name Route Sig Dispense Refill  . PRENATAL MULTIVITAMIN CH Oral Take 1 tablet by mouth  daily.      BP 125/57  Pulse 83  Temp 98.6 F (37 C) (Oral)  Resp 20  Ht 5\' 6"  (1.676 m)  Wt 150 lb (68.04 kg)  BMI 24.21 kg/m2  SpO2 99%  LMP 09/21/2011  Physical Exam  Nursing note and vitals reviewed. Constitutional: She is oriented to person, place, and time. She appears well-developed and well-nourished. No distress.  HENT:  Head: Normocephalic and atraumatic.  Mouth/Throat: Oropharynx is clear and moist.  Eyes: Conjunctivae normal and EOM are normal. Pupils are equal, round, and reactive to light.  Neck: Normal range of motion. Neck supple. Muscular tenderness present. No spinous process tenderness present. Carotid bruit is not present.    Cardiovascular: Normal rate, regular rhythm and intact distal pulses.   No murmur heard. Pulmonary/Chest: Effort normal and breath sounds normal. No respiratory distress. She has no wheezes. She has no rales.  Abdominal: Soft. She exhibits no distension. There is no tenderness. There is no rebound and no guarding.  Musculoskeletal: Normal range of motion. She exhibits no edema and no tenderness.  Neurological: She is alert and oriented to person, place, and time. She has normal strength. No sensory deficit.  Skin: Skin is warm and dry. No rash noted. No erythema.  Psychiatric: She has a normal mood and affect. Her behavior is normal.  ED Course  Procedures (including critical care time)  Labs Reviewed - No data to display No results found.   1. Torticollis, acute       MDM   Patient with pain in the left side of the neck that started about 11 AM today. Patient is displaying poor torticollis with neck rotated to the right. She denies any numbness or tingling in the hands or lower back. She has no prior history of neck problems. Patient states she did have a positive pregnancy test at home but states she recently had a miscarriage in July that the working with the OB/GYN due to hormonal issues. Unclear if the pregnancy test is  a false positive or patient is newly pregnant. However she is not complaining of any symptoms related to pregnancy and has a followup with her doctor next week. Will give patient Flexeril and Vicodin to see if this improves her pain. At this time do not feel that the pregnancy needs to be pursued.        Gwyneth Sprout, MD 11/02/11 1521  Gwyneth Sprout, MD 11/02/11 [phone]

## 2011-11-02 NOTE — ED Notes (Signed)
Left side neck pain started approx 1030pm-denies injury

## 2011-11-05 NOTE — ED Provider Notes (Signed)
Medical screening examination/treatment/procedure(s) were conducted as a shared visit with non-physician practitioner(s) and myself.  I personally evaluated the patient during the encounter   Kayla Sprout, MD 11/05/11 [phone]

## 2011-12-05 ENCOUNTER — Inpatient Hospital Stay (HOSPITAL_COMMUNITY): Payer: Medicaid Other

## 2011-12-05 ENCOUNTER — Inpatient Hospital Stay (HOSPITAL_COMMUNITY)
Admission: AD | Admit: 2011-12-05 | Discharge: 2011-12-05 | Disposition: A | Discharge status: Home / Self Care | Payer: Medicaid Other | Source: Ambulatory Visit | Attending: Obstetrics and Gynecology | Admitting: Obstetrics and Gynecology

## 2011-12-05 ENCOUNTER — Encounter (HOSPITAL_COMMUNITY): Payer: Self-pay | Admitting: *Deleted

## 2011-12-05 DIAGNOSIS — N949 Unspecified condition associated with female genital organs and menstrual cycle: Secondary | ICD-10-CM | POA: Insufficient documentation

## 2011-12-05 DIAGNOSIS — O26899 Other specified pregnancy related conditions, unspecified trimester: Secondary | ICD-10-CM

## 2011-12-05 DIAGNOSIS — N898 Other specified noninflammatory disorders of vagina: Secondary | ICD-10-CM

## 2011-12-05 DIAGNOSIS — O99891 Other specified diseases and conditions complicating pregnancy: Secondary | ICD-10-CM | POA: Insufficient documentation

## 2011-12-05 NOTE — MAU Provider Note (Signed)
History     CSN: [id number]  Arrival date and time: 12/05/11 5784   First Provider Initiated Contact with Patient 12/05/11 1756      Chief Complaint  Patient presents with  . Vaginal Discharge   HPI This is a 21 y.o. female at [redacted]w[redacted]d who presents with c/o brown vaginal discharge. Denies pain or other somatic symptoms. Had a Stillbirth at 35 weeks in July, so is nervous about this baby.   OB History    Grav Para Term Preterm Abortions TAB SAB Ect Mult Living   2 1  1             Past Medical History  Diagnosis Date  . No pertinent past medical history     Past Surgical History  Procedure Date  . Wisdom tooth extraction     Family History  Problem Relation Age of Onset  . Other Neg Hx     History  Substance Use Topics  . Smoking status: Never Smoker   . Smokeless tobacco: Never Used  . Alcohol Use: No    Allergies: No Known Allergies  Prescriptions prior to admission  Medication Sig Dispense Refill  . cyclobenzaprine (FLEXERIL) 10 MG tablet Take 1 tablet (10 mg total) by mouth 2 (two) times daily as needed for muscle spasms.  20 tablet  0  . HYDROcodone-acetaminophen (NORCO/VICODIN) 5-325 MG per tablet Take 1 tablet by mouth every 6 (six) hours as needed. pain      . Prenatal Vit-Fe Fumarate-FA (PRENATAL MULTIVITAMIN) TABS Take 1 tablet by mouth daily.        ROS See HPI  Physical Exam   Blood pressure 120/64, pulse 84, temperature 98.3 F (36.8 C), temperature source Oral, resp. rate 18, height 5\' 4"  (1.626 m), weight 156 lb (70.761 kg), last menstrual period 09/21/2011.  Physical Exam  Constitutional: She is oriented to person, place, and time. She appears well-developed and well-nourished. No distress.  Cardiovascular: Normal rate.   Respiratory: Effort normal.  GI: Soft. She exhibits no distension and no mass. There is no tenderness. There is no rebound and no guarding.  Genitourinary: Uterus normal. Vaginal discharge (small amount dk red/brown  discharge, cervix long and closed) found.       FHR 150s by Doppler  Musculoskeletal: Normal range of motion.  Neurological: She is alert and oriented to person, place, and time.  Skin: Skin is warm and dry.  Psychiatric: She has a normal mood and affect.    MAU Course  Procedures  MDM Discussed with Dr Ellyn Hack. Pt may have [place] if she wants for reassurance >> US Ob Comp Less 14 Wks  12/05/2011  *RADIOLOGY REPORT*  Clinical Data: 21 year old with a prior history of intrauterine fetal demise at 10 weeks in July, 2013.  LMP 09/21/2011 (10 weeks 5 days).  The patient presents now with a vaginal discharge.  OBSTETRIC <14 WK ULTRASOUND  Technique:  Transabdominal ultrasound was performed for evaluation of the gestation as well as the maternal uterus and adnexal regions.  Comparison:  None.  Intrauterine gestational sac: Single, normal in shape. Yolk sac: No longer visualized. Embryo: Visualized. Cardiac Activity: Visualized. Heart Rate: 139 bpm  CRL:  47.7 mm  11 w  4 d            [place] EDC: 06/21/2012.  Maternal uterus/Adnexae: No evidence of subchorionic hemorrhage.  Normal appearing ovaries bilaterally, the left measuring approximately 2.9 x 1.3 x 2.9 cm and the right measuring approximately 3.8 x 1.8 x  1.8 cm.  No adnexal masses or free pelvic fluid.  IMPRESSION:  1.  Single live intrauterine embryo/fetus with estimated gestational age [redacted] weeks 4 days by crown-rump length, EDC 06/21/2012. 2.  No evidence of subchorionic hemorrhage. 3.  Normal-appearing ovaries.  No adnexal masses or free pelvic fluid.   Original Report Authenticated By: Hulan Saas, M.D.     Assessment and Plan  A:  SIUP at [redacted]w[redacted]d         Brown discharge      Living IUP      Recent 35 week IUFD in July  P:  Discharge home       Reassurance given       Followup in office  [facility] 12/05/2011, 6:13 PM

## 2011-12-05 NOTE — MAU Note (Signed)
Patient is in with c/o brown vaginal discharge that started on Sunday. She denies any pain. She had a 34 weeks fetal demise in July 2013. Patient states that she is nervous about seeing any vaginal discharge. She had an ultrasound a month ago.

## 2011-12-05 NOTE — MAU Note (Signed)
Started on Sunday, had a mucous discharge with a brownish d/c... Now is just brown.

## 2012-01-30 NOTE — L&D Delivery Note (Signed)
Delivery Note Pt progressed to complete dilation.  At 2:02 PM a healthy female was delivered via Vaginal, Spontaneous Delivery (Presentation: OA).  APGAR: 8, 9; weight pending.   Placenta status: delivered spontaneously , .  Cord:  with the following complications: none . Anesthesia: None  Episiotomy: none Lacerations: none Suture Repair: n/a Est. Blood Loss (mL): 300cc  Mom to postpartum.  Baby to stay with mother.  Oliver Pila 06/03/2012, 2:36 PM

## 2012-03-08 ENCOUNTER — Encounter (HOSPITAL_COMMUNITY): Payer: Self-pay | Admitting: *Deleted

## 2012-03-08 ENCOUNTER — Inpatient Hospital Stay (HOSPITAL_COMMUNITY)
Admission: AD | Admit: 2012-03-08 | Discharge: 2012-03-08 | Disposition: A | Discharge status: Home / Self Care | Payer: Medicaid Other | Source: Ambulatory Visit | Attending: Obstetrics and Gynecology | Admitting: Obstetrics and Gynecology

## 2012-03-08 DIAGNOSIS — O368190 Decreased fetal movements, unspecified trimester, not applicable or unspecified: Secondary | ICD-10-CM | POA: Insufficient documentation

## 2012-03-08 DIAGNOSIS — O368120 Decreased fetal movements, second trimester, not applicable or unspecified: Secondary | ICD-10-CM

## 2012-03-08 NOTE — MAU Note (Signed)
Pt states she has not felt the baby move since 0730 Friday morning

## 2012-03-08 NOTE — MAU Provider Note (Signed)
Chief Complaint:  Decreased Fetal Movement  First Provider Initiated Contact with Patient 03/08/12 [phone]     HPI: Kayla Moon is a 22 y.o. G2P0100 at [redacted]w[redacted]d who presents to maternity admissions reporting no FM x 24 hours. Has felt FM multiple times since arrival to MAU. Hx 35 week IUFD.   Past Medical History: Past Medical History  Diagnosis Date  . No pertinent past medical history     Past obstetric history: OB History   Grav Para Term Preterm Abortions TAB SAB Ect Mult Living   2 1  1            # Outc Date GA Lbr Len/2nd Wgt Sex Del Anes PTL Lv   1 PRE 7/13 [redacted]w[redacted]d 06:38 / 00:31 2.92kg(6lb7oz) M SVD EPI  SB   Comments: none   2 CUR               Past Surgical History: Past Surgical History  Procedure Laterality Date  . Wisdom tooth extraction      Family History: Family History  Problem Relation Age of Onset  . Other Neg Hx     Social History: History  Substance Use Topics  . Smoking status: Never Smoker   . Smokeless tobacco: Never Used  . Alcohol Use: No    Allergies: No Known Allergies  Meds:  Prescriptions prior to admission  Medication Sig Dispense Refill  . Prenatal Vit-Fe Fumarate-FA (PRENATAL MULTIVITAMIN) TABS Take 1 tablet by mouth daily.      . cyclobenzaprine (FLEXERIL) 10 MG tablet Take 1 tablet (10 mg total) by mouth 2 (two) times daily as needed for muscle spasms.  20 tablet  0  . HYDROcodone-acetaminophen (NORCO/VICODIN) 5-325 MG per tablet Take 1 tablet by mouth every 6 (six) hours as needed. pain        ROS: Denies contractions, leakage of fluid or vaginal bleeding.  Physical Exam  Blood pressure 97/71, pulse 85, temperature 98.1 F (36.7 C), temperature source Oral, resp. rate 20, height 5\' 6"  (1.676 m), weight 76.204 kg (168 lb), last menstrual period 09/21/2011, SpO2 100.00%. GENERAL: Well-developed, well-nourished female in no acute distress. Nervous.  HEENT: normocephalic HEART: normal rate RESP: normal effort ABDOMEN: Soft,  non-tender, gravid appropriate for gestational age NEURO: alert and oriented  FHT:  Baseline 145 , moderate variability, no accelerations present, two mild variables decelerations. Reassuring for gestation.  Contractions: none   Labs: No results found for this or any previous visit (from the past 24 hour(s)).  Imaging:  No results found. MAU Course:   Assessment: 1. Decreased fetal movements in second trimester    Plan: Discharge home Preterm labor precautions and fetal kick counts.   Follow-up Information   Follow up with Oliver Pila, MD On 03/14/2012.   Contact information:   [address], [address] [place] [address] [phone]       Follow up with THE [facility] OF [place] MATERNITY ADMISSIONS. (As needed if symptoms worsen)    Contact information:   [address] [id number] [place] [address] [phone]       Medication List    ASK your doctor about these medications       cyclobenzaprine 10 MG tablet  Commonly known as:  FLEXERIL  Take 1 tablet (10 mg total) by mouth 2 (two) times daily as needed for muscle spasms.     HYDROcodone-acetaminophen 5-325 MG per tablet  Commonly known as:  NORCO/VICODIN  Take 1 tablet by mouth every 6 (  six) hours as needed. pain     prenatal multivitamin Tabs  Take 1 tablet by mouth daily.       [place], [place] 03/08/2012 7:16 AM

## 2012-03-09 NOTE — Progress Notes (Signed)
FHT from 2-8 reviewed.  Reassuring tracing for 24 weeks, no significant decels, no ctx.

## 2012-04-01 ENCOUNTER — Encounter (HOSPITAL_COMMUNITY): Payer: Self-pay

## 2012-04-01 ENCOUNTER — Inpatient Hospital Stay (HOSPITAL_COMMUNITY)
Admission: AD | Admit: 2012-04-01 | Discharge: 2012-04-01 | Disposition: A | Discharge status: Home / Self Care | Payer: Medicaid Other | Source: Ambulatory Visit | Attending: Obstetrics and Gynecology | Admitting: Obstetrics and Gynecology

## 2012-04-01 DIAGNOSIS — O4702 False labor before 37 completed weeks of gestation, second trimester: Secondary | ICD-10-CM

## 2012-04-01 DIAGNOSIS — O4700 False labor before 37 completed weeks of gestation, unspecified trimester: Secondary | ICD-10-CM | POA: Insufficient documentation

## 2012-04-01 DIAGNOSIS — O479 False labor, unspecified: Secondary | ICD-10-CM

## 2012-04-01 HISTORY — DX: Other specified health status: Z78.9

## 2012-04-01 LAB — URINALYSIS, ROUTINE W REFLEX MICROSCOPIC
Glucose, UA: NEGATIVE mg/dL
Hgb urine dipstick: NEGATIVE
Leukocytes, UA: NEGATIVE
Protein, ur: NEGATIVE mg/dL
pH: 7 (ref 5.0–8.0)

## 2012-04-01 NOTE — MAU Note (Signed)
Was contracting, fell asleep, then they woke her again, has been off and on, has been fading in last hour.hx of PTL,  Gets weekly 17 injections.

## 2012-04-01 NOTE — MAU Provider Note (Signed)
History     CSN: [id number]  Arrival date and time: 04/01/12 1820   First Provider Initiated Contact with Patient 04/01/12 1923      Chief Complaint  Patient presents with  . Labor Eval   HPI Kayla Moon is 22 y.o. G2P0100 [redacted]w[redacted]d weeks presenting with contractions that began at noon, feel asleep, woke up at 2 and had  6 contractions from 3-4pm.  Call the office (patient of Dr. Senaida Ores)  and said that no one answered.  She called here and came in after having 6 more after that.  None since 6:40.  Hx of fetal demise at 35 weeks.  Hx of preterm labor with that pregnancy.  Now getting 17P injections weekly--last one 2/28.  Saw Dr. Ambrose Mantle that date told cervix was closed.  Denies leaking of fluid or vaginal bleeding, does have watery discharge.  Last intercourse 2 days ago.     Past Medical History  Diagnosis Date  . No pertinent past medical history   . Medical history non-contributory     Past Surgical History  Procedure Laterality Date  . Wisdom tooth extraction      Family History  Problem Relation Age of Onset  . Other Neg Hx     History  Substance Use Topics  . Smoking status: Never Smoker   . Smokeless tobacco: Never Used  . Alcohol Use: No    Allergies: No Known Allergies  Prescriptions prior to admission  Medication Sig Dispense Refill  . hydroxyprogesterone caproate (DELALUTIN) 250 mg/mL OIL Inject 250 mg into the muscle once.      . Prenatal Vit-Fe Fumarate-FA (PRENATAL MULTIVITAMIN) TABS Take 1 tablet by mouth daily.        Review of Systems  Constitutional: Negative.   HENT: Negative.   Gastrointestinal: Positive for abdominal pain (? contractions). Negative for nausea and vomiting.  Genitourinary:       Liquid like discharge.  Denies leaking or gush of fluid, vaginal bleeding  Psychiatric/Behavioral: The patient is nervous/anxious.    Physical Exam   Blood pressure 118/67, pulse 86, temperature 98.3 F (36.8 C), temperature source Oral, resp.  rate 18, height 5\' 4"  (1.626 m), weight 173 lb (78.472 kg), last menstrual period 09/21/2011.  Physical Exam  Constitutional: She is oriented to person, place, and time. She appears well-developed and well-nourished. No distress.  HENT:  Head: Normocephalic.  Neck: Normal range of motion.  Cardiovascular: Normal rate.   Respiratory: Effort normal.  GI: Soft. She exhibits no distension and no mass. There is no tenderness. There is no rebound and no guarding.  Neurological: She is alert and oriented to person, place, and time.  Skin: Skin is warm and dry.  Psychiatric: She has a normal mood and affect. Her behavior is normal.    MAU Course  Procedures  FMS baseline 145 moderated variability 10x10, 1 small variable and 1 contraction  MDM Reported MSE, FMS to Dr. Ellyn Hack.   FFN and gentle cervical exam.. Instructed to have patient begin pelvic rest.  Follow up in office for worsening sxs.  Care turned over to V. Katrinka Blazing, CNM  Assessment and Plan  A:  Braxton-hicks contractions      [redacted]w[redacted]d gestation  P:Call  Dr. Berenda Morale office for increase in sxs.    Instructed --pelvic rest  KEY,EVE M 04/01/2012, 7:28 PM   Care of pt assumed at 2000. Dilation: Closed Effacement (%): Thick Cervical Position: Posterior Station: Ballotable Presentation: Undeterminable Exam by:: Dorathy Kinsman CNM  fFN collected.  Per Dr. Ellyn Hack, send only if pt requests. Pt does not want sent.   D/C home. Pelvic rest PTL precautions. Increase fluids and rest. Follow-up Information   Follow up with Oliver Pila, MD On 04/02/2012.   Contact information:   [address], [address] [place] [address] [phone]       Follow up with THE [facility] OF [place] MATERNITY ADMISSIONS. (As needed if symptoms worsen)    Contact information:   [address] [id number] [place] [address] [phone]       Medication List    TAKE these medications       hydroxyprogesterone  caproate 250 mg/mL Oil  Commonly known as:  DELALUTIN  Inject 250 mg into the muscle once.     prenatal multivitamin Tabs  Take 1 tablet by mouth daily.       [place], [place] 04/01/2012 8:49 PM

## 2012-04-01 NOTE — MAU Note (Signed)
Name and DOB verified. Pt confirmed spelling is correct on armband. 

## 2012-04-10 ENCOUNTER — Encounter (HOSPITAL_COMMUNITY): Payer: Self-pay

## 2012-04-10 ENCOUNTER — Inpatient Hospital Stay (HOSPITAL_COMMUNITY)
Admission: AD | Admit: 2012-04-10 | Discharge: 2012-04-10 | Disposition: A | Discharge status: Home / Self Care | Payer: Medicaid Other | Source: Ambulatory Visit | Attending: Obstetrics and Gynecology | Admitting: Obstetrics and Gynecology

## 2012-04-10 DIAGNOSIS — O4700 False labor before 37 completed weeks of gestation, unspecified trimester: Secondary | ICD-10-CM | POA: Insufficient documentation

## 2012-04-10 LAB — URINALYSIS, ROUTINE W REFLEX MICROSCOPIC
Glucose, UA: 100 mg/dL — AB
Leukocytes, UA: NEGATIVE
Nitrite: NEGATIVE
Specific Gravity, Urine: 1.02 (ref 1.005–1.030)
pH: 7 (ref 5.0–8.0)

## 2012-04-10 NOTE — MAU Provider Note (Signed)
Chief Complaint:  Contractions   First Provider Initiated Contact with Patient 04/10/12 1752      HPI: Kayla Moon is a 22 y.o. G2P0100 at [redacted]w[redacted]d who presents to maternity admissions reporting intermittent cramping pain accompanied by sharp lower abdominal pain that is also intermittent, but shorter in duration than cramping.  She reports good fetal movement, denies LOF, vaginal bleeding, vaginal itching/burning, urinary symptoms, h/a, dizziness, n/v, or fever/chills.    Her obstetric history is significant for IUFD with preterm delivery.  She reports a positive FFN in office yesterday.  Past Medical History: Past Medical History  Diagnosis Date  . No pertinent past medical history   . Medical history non-contributory     Past obstetric history: OB History   Grav Para Term Preterm Abortions TAB SAB Ect Mult Living   2 1 0 1 0 0 0 0 0 0      # Outc Date GA Lbr Len/2nd Wgt Sex Del Anes PTL Lv   1 PRE 7/13 [redacted]w[redacted]d 06:38 / 00:31 2.92kg(6lb7oz) M SVD EPI  SB   Comments: none   2 CUR               Past Surgical History: Past Surgical History  Procedure Laterality Date  . Wisdom tooth extraction      Family History: Family History  Problem Relation Age of Onset  . Other Neg Hx     Social History: History  Substance Use Topics  . Smoking status: Never Smoker   . Smokeless tobacco: Never Used  . Alcohol Use: No    Allergies: No Known Allergies  Meds:  Prescriptions prior to admission  Medication Sig Dispense Refill  . ferrous sulfate 325 (65 FE) MG tablet Take 325 mg by mouth daily.      . hydroxyprogesterone caproate (DELALUTIN) 250 mg/mL OIL Inject 250 mg into the muscle once.      . Prenatal Vit-Fe Fumarate-FA (PRENATAL MULTIVITAMIN) TABS Take 1 tablet by mouth daily.        ROS: Pertinent findings in history of present illness.  Physical Exam  Blood pressure 117/62, pulse 111, temperature 98.1 F (36.7 C), temperature source Oral, resp. rate 18, height 5\' 4"   (1.626 m), weight 79.379 kg (175 lb), last menstrual period 09/21/2011. GENERAL: Well-developed, well-nourished female in no acute distress.  HEENT: normocephalic HEART: normal rate RESP: normal effort ABDOMEN: Soft, non-tender, gravid appropriate for gestational age EXTREMITIES: Nontender, no edema NEURO: alert and oriented SPECULUM EXAM: Deferred    Cervix 1/long/high, posterior, medium consistency  FHT:  Baseline 145 , moderate variability, accelerations present (10x10), variables lasting 30 seconds x2 Contractions: rare on toco, mild to palpation   Labs: Results for orders placed during the hospital encounter of 04/10/12 (from the past 24 hour(s))  URINALYSIS, ROUTINE W REFLEX MICROSCOPIC     Status: Abnormal   Collection Time    04/10/12  4:50 PM      Result Value Range   Color, Urine YELLOW  YELLOW   APPearance CLEAR  CLEAR   Specific Gravity, Urine 1.020  1.005 - 1.030   pH 7.0  5.0 - 8.0   Glucose, UA 100 (*) NEGATIVE mg/dL   Hgb urine dipstick NEGATIVE  NEGATIVE   Bilirubin Urine NEGATIVE  NEGATIVE   Ketones, ur NEGATIVE  NEGATIVE mg/dL   Protein, ur NEGATIVE  NEGATIVE mg/dL   Urobilinogen, UA 0.2  0.0 - 1.0 mg/dL   Nitrite NEGATIVE  NEGATIVE   Leukocytes, UA NEGATIVE  NEGATIVE  Assessment: 1. Threatened preterm labor, antepartum, third trimester     Plan: Discussed assessment and findings, including fetal monitoring, with Dr Jackelyn Knife, confirmed positive FFN results in office Discharge home Labor precautions and fetal kick counts F/U in office  Return to MAU as needed    Medication List    ASK your doctor about these medications       ferrous sulfate 325 (65 FE) MG tablet  Take 325 mg by mouth daily.     hydroxyprogesterone caproate 250 mg/mL Oil  Commonly known as:  DELALUTIN  Inject 250 mg into the muscle once.     prenatal multivitamin Tabs  Take 1 tablet by mouth daily.        Sharen Counter Certified Nurse-Midwife 04/10/2012 6:21  PM

## 2012-04-10 NOTE — MAU Note (Signed)
Contractions continue, unchanged in last 2 wks.  Having pelvic pressure.  Contractions maybe lasting a little longer now.

## 2012-04-10 NOTE — MAU Note (Signed)
Pt states has been having contractions x2 weeks. Office knows pt has been having ctx's however not taking any prescribed medications for ctx's. Denies abnormal vaginal discharge or bleeding.

## 2012-04-12 ENCOUNTER — Inpatient Hospital Stay (HOSPITAL_COMMUNITY)
Admission: AD | Admit: 2012-04-12 | Discharge: 2012-04-13 | Disposition: A | Discharge status: Home / Self Care | Payer: Medicaid Other | Source: Ambulatory Visit | Attending: Obstetrics and Gynecology | Admitting: Obstetrics and Gynecology

## 2012-04-12 ENCOUNTER — Encounter (HOSPITAL_COMMUNITY): Payer: Self-pay

## 2012-04-12 DIAGNOSIS — O26899 Other specified pregnancy related conditions, unspecified trimester: Secondary | ICD-10-CM

## 2012-04-12 DIAGNOSIS — K5900 Constipation, unspecified: Secondary | ICD-10-CM | POA: Insufficient documentation

## 2012-04-12 DIAGNOSIS — R109 Unspecified abdominal pain: Secondary | ICD-10-CM | POA: Insufficient documentation

## 2012-04-12 DIAGNOSIS — O99891 Other specified diseases and conditions complicating pregnancy: Secondary | ICD-10-CM | POA: Insufficient documentation

## 2012-04-12 DIAGNOSIS — D649 Anemia, unspecified: Secondary | ICD-10-CM

## 2012-04-12 LAB — URINALYSIS, ROUTINE W REFLEX MICROSCOPIC
Leukocytes, UA: NEGATIVE
Nitrite: NEGATIVE
Specific Gravity, Urine: 1.025 (ref 1.005–1.030)
Urobilinogen, UA: 1 mg/dL (ref 0.0–1.0)
pH: 6.5 (ref 5.0–8.0)

## 2012-04-12 NOTE — MAU Provider Note (Signed)
History     CSN: [id number]  Arrival date and time: 04/12/12 2252   First Provider Initiated Contact with Patient 04/12/12 2343      Chief Complaint  Patient presents with  . Abdominal Pain   HPI Kayla Moon is a 22 y.o. female @ [redacted]w[redacted]d gestation who presents to MAU with abdominal tightening. The symptoms started last night and has continued through out the day. The pain is located in the lower abdomen. She rates the pain as 6/10. She has not taken anything for pain. Walking makes the pain worse. Nothing makes it better. Was here earlier in the week for same.  Denies nausea, vomiting, fever or chills. No leaking of fluid or vaginal bleeding. Last sexual intercourse 1 week ago. The history was provided by the patient.   OB History   Grav Para Term Preterm Abortions TAB SAB Ect Mult Living   2 1 0 1 0 0 0 0 0 0       Past Medical History  Diagnosis Date  . No pertinent past medical history   . Medical history non-contributory     Past Surgical History  Procedure Laterality Date  . Wisdom tooth extraction      Family History  Problem Relation Age of Onset  . Other Neg Hx     History  Substance Use Topics  . Smoking status: Never Smoker   . Smokeless tobacco: Never Used  . Alcohol Use: No    Allergies: No Known Allergies  Prescriptions prior to admission  Medication Sig Dispense Refill  . ferrous sulfate 325 (65 FE) MG tablet Take 325 mg by mouth daily.      . hydroxyprogesterone caproate (DELALUTIN) 250 mg/mL OIL Inject 250 mg into the muscle once.      . Prenatal Vit-Fe Fumarate-FA (PRENATAL MULTIVITAMIN) TABS Take 1 tablet by mouth daily.        Review of Systems  Constitutional: Negative for fever, chills and weight loss.  HENT: Negative for ear pain, nosebleeds, congestion and sore throat.   Eyes: Negative for blurred vision and double vision.  Respiratory: Negative for cough, shortness of breath and wheezing.   Cardiovascular: Negative for chest pain,  palpitations and leg swelling.  Gastrointestinal: Positive for abdominal pain and constipation. Negative for heartburn, nausea, vomiting and diarrhea.  Genitourinary: Negative for dysuria, urgency and frequency.  Musculoskeletal: Negative for back pain.  Skin: Negative for rash.  Neurological: Negative for dizziness, seizures, weakness and headaches.  Endo/Heme/Allergies: Does not bruise/bleed easily.  Psychiatric/Behavioral: Negative for depression. The patient is not nervous/anxious.    Blood pressure 120/65, pulse 107, temperature 97.5 F (36.4 C), temperature source Oral, resp. rate 18, height 5\' 4"  (1.626 m), weight 174 lb 12.8 oz (79.289 kg), last menstrual period 09/21/2011.  Physical Exam  Nursing note and vitals reviewed. Constitutional: She is oriented to person, place, and time. She appears well-developed and well-nourished. No distress.  HENT:  Head: Normocephalic and atraumatic.  Eyes: EOM are normal.  Neck: Neck supple.  Cardiovascular: Tachycardia present.   Respiratory: Effort normal.  GI: Soft. There is tenderness in the right lower quadrant, suprapubic area and left lower quadrant.  Genitourinary: Rectal exam shows no tenderness.  Dilation: Fingertip Effacement (%): Thick Cervical Position: Posterior Station: -3 Presentation: Undeterminable Exam by:: Laurell Josephs RN   Rectal exam, good tone, no stool palpated, no mass palpated.  Musculoskeletal: Normal range of motion. She exhibits no edema.  Neurological: She is alert and oriented to person, place, and time.  Skin: Skin is warm and dry.  Psychiatric: She has a normal mood and affect. Her behavior is normal. Judgment and thought content normal.   EFM: Baseline 125, reactive, irritability with occasional contraction   MAU Course 23:58 Call to Dr. Ellyn Hack discussed clinical findings, will order CBC and check cervix.   Procedures  Results for orders placed during the hospital encounter of 04/12/12 (from the  past 24 hour(s))  URINALYSIS, ROUTINE W REFLEX MICROSCOPIC     Status: Abnormal   Collection Time    04/12/12 11:00 PM      Result Value Range   Color, Urine YELLOW  YELLOW   APPearance CLEAR  CLEAR   Specific Gravity, Urine 1.025  1.005 - 1.030   pH 6.5  5.0 - 8.0   Glucose, UA 100 (*) NEGATIVE mg/dL   Hgb urine dipstick NEGATIVE  NEGATIVE   Bilirubin Urine NEGATIVE  NEGATIVE   Ketones, ur NEGATIVE  NEGATIVE mg/dL   Protein, ur NEGATIVE  NEGATIVE mg/dL   Urobilinogen, UA 1.0  0.0 - 1.0 mg/dL   Nitrite NEGATIVE  NEGATIVE   Leukocytes, UA NEGATIVE  NEGATIVE  CBC WITH DIFFERENTIAL     Status: Abnormal   Collection Time    04/13/12 12:11 AM      Result Value Range   WBC 12.3 (*) 4.0 - 10.5 K/uL   RBC 3.84 (*) 3.87 - 5.11 MIL/uL   Hemoglobin 10.4 (*) 12.0 - 15.0 g/dL   HCT 96.2 (*) 95.2 - 84.1 %   MCV 82.8  78.0 - 100.0 fL   MCH 27.1  26.0 - 34.0 pg   MCHC 32.7  30.0 - 36.0 g/dL   RDW 32.4  40.1 - 02.7 %   Platelets 145 (*) 150 - 400 K/uL   Neutrophils Relative 67  43 - 77 %   Neutro Abs 8.2 (*) 1.7 - 7.7 K/uL   Lymphocytes Relative 18  12 - 46 %   Lymphs Abs 2.3  0.7 - 4.0 K/uL   Monocytes Relative 11  3 - 12 %   Monocytes Absolute 1.3 (*) 0.1 - 1.0 K/uL   Eosinophils Relative 4  0 - 5 %   Eosinophils Absolute 0.5  0.0 - 0.7 K/uL   Basophils Relative 0  0 - 1 %   Basophils Absolute 0.0  0.0 - 0.1 K/uL    Assessment: 22 y.o. female @ [redacted]w[redacted]d gestation with abdominal pain   Round ligament pain   Constipation  Plan:  Discussed with the patient pregnancy girdle, tylenol and rest   Discussed diet for constipation.   Follow up in the office, return as needed.  MDM Patient in no acute distress, appears comfortable. I have reviewed this patient's vital signs, nurses notes, appropriate labs and discussed finding with the patient. I have discussed plan of care and patient voices understanding.    Medication List    TAKE these medications       ferrous sulfate 325 (65 FE) MG  tablet  Take 325 mg by mouth daily.     hydroxyprogesterone caproate 250 mg/mL Oil  Commonly known as:  DELALUTIN  Inject 250 mg into the muscle once.     prenatal multivitamin Tabs  Take 1 tablet by mouth daily.         Brandilynn Taormina, RN, FNP, [facility] 04/12/2012, 11:44 PM

## 2012-04-12 NOTE — MAU Note (Signed)
Patient reports having abdominal discomfort for a week has been here several times this past week.

## 2012-04-13 LAB — CBC WITH DIFFERENTIAL/PLATELET
Basophils Absolute: 0 K/uL (ref 0.0–0.1)
Basophils Relative: 0 % (ref 0–1)
Eosinophils Absolute: 0.5 K/uL (ref 0.0–0.7)
Eosinophils Relative: 4 % (ref 0–5)
HCT: 31.8 % — ABNORMAL LOW (ref 36.0–46.0)
Hemoglobin: 10.4 g/dL — ABNORMAL LOW (ref 12.0–15.0)
Lymphocytes Relative: 18 % (ref 12–46)
Lymphs Abs: 2.3 K/uL (ref 0.7–4.0)
MCH: 27.1 pg (ref 26.0–34.0)
MCHC: 32.7 g/dL (ref 30.0–36.0)
MCV: 82.8 fL (ref 78.0–100.0)
Monocytes Absolute: 1.3 K/uL — ABNORMAL HIGH (ref 0.1–1.0)
Monocytes Relative: 11 % (ref 3–12)
Neutro Abs: 8.2 K/uL — ABNORMAL HIGH (ref 1.7–7.7)
Neutrophils Relative %: 67 % (ref 43–77)
Platelets: 145 K/uL — ABNORMAL LOW (ref 150–400)
RBC: 3.84 MIL/uL — ABNORMAL LOW (ref 3.87–5.11)
RDW: 13.7 % (ref 11.5–15.5)
WBC: 12.3 K/uL — ABNORMAL HIGH (ref 4.0–10.5)

## 2012-04-21 ENCOUNTER — Inpatient Hospital Stay (HOSPITAL_COMMUNITY)
Admission: AD | Admit: 2012-04-21 | Discharge: 2012-04-22 | Disposition: A | Discharge status: Hospice | Payer: Medicaid Other | Source: Ambulatory Visit | Attending: Obstetrics and Gynecology | Admitting: Obstetrics and Gynecology

## 2012-04-21 ENCOUNTER — Encounter (HOSPITAL_COMMUNITY): Payer: Self-pay | Admitting: *Deleted

## 2012-04-21 DIAGNOSIS — R1031 Right lower quadrant pain: Secondary | ICD-10-CM | POA: Insufficient documentation

## 2012-04-21 DIAGNOSIS — O4700 False labor before 37 completed weeks of gestation, unspecified trimester: Secondary | ICD-10-CM | POA: Insufficient documentation

## 2012-04-21 DIAGNOSIS — O479 False labor, unspecified: Secondary | ICD-10-CM

## 2012-04-21 DIAGNOSIS — N949 Unspecified condition associated with female genital organs and menstrual cycle: Secondary | ICD-10-CM

## 2012-04-21 LAB — URINALYSIS, ROUTINE W REFLEX MICROSCOPIC
Glucose, UA: 100 mg/dL — AB
Leukocytes, UA: NEGATIVE
Protein, ur: NEGATIVE mg/dL
Specific Gravity, Urine: 1.025 (ref 1.005–1.030)

## 2012-04-21 LAB — WET PREP, GENITAL
Trich, Wet Prep: NONE SEEN
Yeast Wet Prep HPF POC: NONE SEEN

## 2012-04-21 MED ORDER — CYCLOBENZAPRINE HCL 10 MG PO TABS
10.0000 mg | ORAL_TABLET | Freq: Once | ORAL | Status: AC
  Administered 2012-04-21: 10 mg via ORAL
  Filled 2012-04-21: qty 1

## 2012-04-21 MED ORDER — ACETAMINOPHEN 325 MG PO TABS
650.0000 mg | ORAL_TABLET | Freq: Once | ORAL | Status: AC
  Administered 2012-04-21: 650 mg via ORAL
  Filled 2012-04-21: qty 2

## 2012-04-21 NOTE — MAU Note (Signed)
Pt c/o sharp right lower abd pain since yesterday that worsens when she walks.  States she also had diarrhea early this morning with continued pain in her mid abdomen up to the present time.  Says she has eaten today but has no appetite. No vomiting or nausea. Feels good fetal movement.

## 2012-04-21 NOTE — MAU Provider Note (Signed)
History     CSN: [id number]  Arrival date and time: 04/21/12 2004   First Provider Initiated Contact with Patient 04/21/12 2108      No chief complaint on file.  HPI Ms. Kayla Moon is a 22 y.o. G2P0100 at [redacted]w[redacted]d who presents to MAU with complaint of RLQ pain. The patient states that the pain started yesterday. She denies N/V, fever or decreased appetite. She has had mild mid-abdominal cramping associated with a loose stool earlier today. This was a single episode.  She denies vaginal bleeding. She complains of a watery discharge x 2-3 days. She has been having irregular contractions for the last ~ 3 weeks. She states < 5/hour. She was last seen in the office on Wednesday and her cervix was closed. She reports good fetal movement.   OB History   Grav Para Term Preterm Abortions TAB SAB Ect Mult Living   2 1 0 1 0 0 0 0 0 0       Past Medical History  Diagnosis Date  . No pertinent past medical history   . Medical history non-contributory     Past Surgical History  Procedure Laterality Date  . Wisdom tooth extraction      Family History  Problem Relation Age of Onset  . Other Neg Hx   . Cancer Maternal Grandfather     History  Substance Use Topics  . Smoking status: Never Smoker   . Smokeless tobacco: Never Used  . Alcohol Use: No    Allergies: No Known Allergies  Prescriptions prior to admission  Medication Sig Dispense Refill  . ferrous sulfate 325 (65 FE) MG tablet Take 325 mg by mouth daily.      . hydroxyprogesterone caproate (DELALUTIN) 250 mg/mL OIL Inject 250 mg into the muscle once.      . Prenatal Vit-Fe Fumarate-FA (PRENATAL MULTIVITAMIN) TABS Take 1 tablet by mouth daily.        Review of Systems  Constitutional: Negative for fever and malaise/fatigue.  Gastrointestinal: Positive for abdominal pain and diarrhea. Negative for nausea, vomiting and constipation.  Genitourinary: Negative for dysuria, urgency and frequency.       Neg - vaginal  bleeding + watery discharge  Musculoskeletal: Negative for back pain.   Physical Exam   Blood pressure 114/74, pulse 112, temperature 98 F (36.7 C), temperature source Oral, resp. rate 18, height 5\' 6"  (1.676 m), weight 178 lb (80.74 kg), last menstrual period 09/21/2011.  Physical Exam  Constitutional: She is oriented to person, place, and time. She appears well-developed and well-nourished.  HENT:  Head: Normocephalic and atraumatic.  Cardiovascular: Normal rate, regular rhythm and normal heart sounds.   Respiratory: Effort normal and breath sounds normal. No respiratory distress.  GI: Soft. Bowel sounds are normal. She exhibits no distension and no mass. There is tenderness (mild tenderness to palpation of the RLQ to midline of abdomen). There is no rebound and no guarding.  Neurological: She is alert and oriented to person, place, and time.  Skin: Skin is warm and dry. No erythema.  Psychiatric: She has a normal mood and affect.  Dilation: Closed Effacement (%): 40 Exam by:: Elie Confer RN Neg - pooling  Fetal monitoring: Baseline: 125 bpm, moderate variability, + accelerations, no decelerations Contractions: occasional  Results for orders placed during the hospital encounter of 04/21/12 (from the past 24 hour(s))  URINALYSIS, ROUTINE W REFLEX MICROSCOPIC     Status: Abnormal   Collection Time    04/21/12  8:05 PM  Result Value Range   Color, Urine YELLOW  YELLOW   APPearance CLEAR  CLEAR   Specific Gravity, Urine 1.025  1.005 - 1.030   pH 6.5  5.0 - 8.0   Glucose, UA 100 (*) NEGATIVE mg/dL   Hgb urine dipstick NEGATIVE  NEGATIVE   Bilirubin Urine NEGATIVE  NEGATIVE   Ketones, ur NEGATIVE  NEGATIVE mg/dL   Protein, ur NEGATIVE  NEGATIVE mg/dL   Urobilinogen, UA 0.2  0.0 - 1.0 mg/dL   Nitrite NEGATIVE  NEGATIVE   Leukocytes, UA NEGATIVE  NEGATIVE  WET PREP, GENITAL     Status: Abnormal   Collection Time    04/21/12  9:30 PM      Result Value Range   Yeast Wet  Prep HPF POC NONE SEEN  NONE SEEN   Trich, Wet Prep NONE SEEN  NONE SEEN   Clue Cells Wet Prep HPF POC NONE SEEN  NONE SEEN   WBC, Wet Prep HPF POC FEW (*) NONE SEEN  POCT FERN TEST     Status: None   Collection Time    04/21/12  9:39 PM      Result Value Range   POCT Fern Test Negative = intact amniotic membranes       MAU Course  Procedures None  MDM Discussed with Dr. Ambrose Mantle. He agrees if NST is reactive that patient may be discharged with instructions to keep follow-up for later this week.   Assessment and Plan  A: Braxton-Hicks contractions Round ligament pain  P: Discharge home Labor precautions and Kick counts given Patient encouraged to keep follow-up as scheduled Rx for flexeril sent to patient's pharmacy Patient may return to MAU as needed or if her condition were to change or worsen  Freddi Starr, PA-C  04/21/2012, 9:09 PM

## 2012-04-22 MED ORDER — CYCLOBENZAPRINE HCL 10 MG PO TABS: 10.0000 mg | ORAL_TABLET | Freq: Two times a day (BID) | ORAL | Status: DC | PRN

## 2012-05-06 ENCOUNTER — Inpatient Hospital Stay (HOSPITAL_COMMUNITY): Payer: Medicaid Other

## 2012-05-06 ENCOUNTER — Inpatient Hospital Stay (HOSPITAL_COMMUNITY)
Admission: AD | Admit: 2012-05-06 | Discharge: 2012-05-06 | Disposition: A | Discharge status: Hospice | Payer: Medicaid Other | Source: Ambulatory Visit | Attending: Obstetrics and Gynecology | Admitting: Obstetrics and Gynecology

## 2012-05-06 ENCOUNTER — Encounter (HOSPITAL_COMMUNITY): Payer: Self-pay | Admitting: *Deleted

## 2012-05-06 DIAGNOSIS — O368190 Decreased fetal movements, unspecified trimester, not applicable or unspecified: Secondary | ICD-10-CM

## 2012-05-06 DIAGNOSIS — O368131 Decreased fetal movements, third trimester, fetus 1: Secondary | ICD-10-CM

## 2012-05-06 NOTE — MAU Provider Note (Signed)
History     CSN: [id number]  Arrival date and time: 05/06/12 0030   None     No chief complaint on file.  HPI  Kayla Moon is a 22 y.o. G2P0100 at [redacted]w[redacted]d who presents today with decreased fetal movement. She has not felt the baby move since 2300, and states that overall the baby has not been moving as much as normal today. She states she had a NST in the office today, and they told her it was "good, but not great". She has 2x weekly NSTs 2/2 history of fetal demise.  She denies any vaginal bleeding, discharge or LOF. She is having some contractions as well, but states they are not any different from the contractions she has been having for a while now.   Past Medical History  Diagnosis Date  . No pertinent past medical history   . Medical history non-contributory     Past Surgical History  Procedure Laterality Date  . Wisdom tooth extraction      Family History  Problem Relation Age of Onset  . Other Neg Hx   . Cancer Maternal Grandfather     History  Substance Use Topics  . Smoking status: Never Smoker   . Smokeless tobacco: Never Used  . Alcohol Use: No    Allergies: No Known Allergies  Prescriptions prior to admission  Medication Sig Dispense Refill  . ferrous sulfate 325 (65 FE) MG tablet Take 325 mg by mouth daily.      . hydroxyprogesterone caproate (DELALUTIN) 250 mg/mL OIL Inject 250 mg into the muscle once.      . Prenatal Vit-Fe Fumarate-FA (PRENATAL MULTIVITAMIN) TABS Take 1 tablet by mouth daily.      . cyclobenzaprine (FLEXERIL) 10 MG tablet Take 1 tablet (10 mg total) by mouth 2 (two) times daily as needed for muscle spasms.  20 tablet  0    Review of Systems  Constitutional: Negative for fever.  Eyes: Negative for blurred vision.  Gastrointestinal: Negative for nausea, vomiting, abdominal pain, diarrhea and constipation.  Genitourinary: Negative for dysuria, urgency and frequency.  Neurological: Negative for dizziness and headaches.   Physical  Exam   Temperature 97.9 F (36.6 C), temperature source Oral, resp. rate 16, height 5\' 6"  (1.676 m), weight 178 lb (80.74 kg), last menstrual period 09/21/2011.  Physical Exam  Nursing note and vitals reviewed. Constitutional: She is oriented to person, place, and time. She appears well-developed and well-nourished. No distress.  Cardiovascular: Normal rate.   Respiratory: Effort normal.  GI: Soft.  Neurological: She is alert and oriented to person, place, and time.  Skin: Skin is warm and dry.  Psychiatric: She has a normal mood and affect.   FNT: Cat I Toco: irregular UCs MAU Course  Procedures  0120: Spoke with Dr. Ellyn Hack. States NST was reactive in the office today. Discussed current NST, with good variability, but only one acceleration at this time. Plan to send patient for BPP.    *RADIOLOGY REPORT*  Clinical Data: Decreased fetal movement.  BIOPHYSICAL PROFILE  Number of Fetuses: 1  Heart Rate: 147 bpm  Presentation: Cephalic  Amniotic Fluid (Subjective): Normal  Vertical pocket: 6.2cm  BPP:  Movement: 2 / 2 Time: 4 minutes  Breathing: 2 / 2  Tone: 2 / 2  Amniotic Fluid: 2 / 2  Total Score: 8 / 8  IMPRESSION:  Biophysical profile score of 8/8; normal fetal movement visualized.  Original Report Authenticated By: Tonia Ghent, M.D.   Assessment and  Plan   1. Decreased fetal movement in pregnancy, antepartum, third trimester, fetus 1    Continue fetal kick counts FU with the office as scheduled Return to MAU as needed  Tawnya Crook 05/06/2012, 12:52 AM

## 2012-05-06 NOTE — MAU Note (Signed)
PT SAYS SHE IS G2P0-  FIRST BABY WAS STILLBORN AT 35 WEEKS.  EDC- 5-28.  LAST TIME BABY MOVED WAS  2330- FHR- 148.  DENIES  VAG BLEEDING , NOT SROM, HSV, MRSA.   TO B-ROOM - FOR SPECIMEN

## 2012-05-20 ENCOUNTER — Observation Stay (HOSPITAL_COMMUNITY)
Admission: AD | Admit: 2012-05-20 | Discharge: 2012-05-21 | Disposition: A | Discharge status: Home / Self Care | Payer: Medicaid Other | Source: Ambulatory Visit | Attending: Obstetrics and Gynecology | Admitting: Obstetrics and Gynecology

## 2012-05-20 ENCOUNTER — Encounter (HOSPITAL_COMMUNITY): Payer: Self-pay

## 2012-05-20 DIAGNOSIS — O4700 False labor before 37 completed weeks of gestation, unspecified trimester: Principal | ICD-10-CM | POA: Insufficient documentation

## 2012-05-20 DIAGNOSIS — Z8759 Personal history of other complications of pregnancy, childbirth and the puerperium: Secondary | ICD-10-CM | POA: Insufficient documentation

## 2012-05-20 DIAGNOSIS — O368390 Maternal care for abnormalities of the fetal heart rate or rhythm, unspecified trimester, not applicable or unspecified: Secondary | ICD-10-CM | POA: Insufficient documentation

## 2012-05-20 DIAGNOSIS — O479 False labor, unspecified: Secondary | ICD-10-CM

## 2012-05-20 HISTORY — DX: Personal history of other complications of pregnancy, childbirth and the puerperium: Z87.59

## 2012-05-20 LAB — CBC
HCT: 34.3 % — ABNORMAL LOW (ref 36.0–46.0)
Hemoglobin: 10.9 g/dL — ABNORMAL LOW (ref 12.0–15.0)
RDW: 14.8 % (ref 11.5–15.5)
WBC: 12.4 10*3/uL — ABNORMAL HIGH (ref 4.0–10.5)

## 2012-05-20 LAB — URINALYSIS, ROUTINE W REFLEX MICROSCOPIC
Bilirubin Urine: NEGATIVE
Glucose, UA: NEGATIVE mg/dL
Ketones, ur: NEGATIVE mg/dL
pH: 6.5 (ref 5.0–8.0)

## 2012-05-20 LAB — GROUP B STREP BY PCR: Group B strep by PCR: NEGATIVE

## 2012-05-20 MED ORDER — DOCUSATE SODIUM 100 MG PO CAPS: 100.0000 mg | ORAL_CAPSULE | Freq: Every day | ORAL | Status: DC

## 2012-05-20 MED ORDER — PENICILLIN G POTASSIUM 5000000 UNITS IJ SOLR
5.0000 10*6.[IU] | Freq: Once | INTRAVENOUS | Status: DC
  Filled 2012-05-20: qty 5

## 2012-05-20 MED ORDER — SODIUM CHLORIDE 0.9 % IV SOLN: 250.0000 mL | INTRAVENOUS | Status: DC | PRN

## 2012-05-20 MED ORDER — LACTATED RINGERS IV BOLUS (SEPSIS)
1000.0000 mL | Freq: Once | INTRAVENOUS | Status: AC
  Administered 2012-05-20: 1000 mL via INTRAVENOUS

## 2012-05-20 MED ORDER — ZOLPIDEM TARTRATE 5 MG PO TABS
5.0000 mg | ORAL_TABLET | Freq: Every evening | ORAL | Status: DC | PRN
  Administered 2012-05-21: 5 mg via ORAL
  Filled 2012-05-20 (×2): qty 1

## 2012-05-20 MED ORDER — PRENATAL MULTIVITAMIN CH: 1.0000 | ORAL_TABLET | Freq: Every day | ORAL | Status: DC

## 2012-05-20 MED ORDER — PENICILLIN G POTASSIUM 5000000 UNITS IJ SOLR
2.5000 10*6.[IU] | INTRAVENOUS | Status: DC
  Filled 2012-05-20: qty 2.5

## 2012-05-20 MED ORDER — CALCIUM CARBONATE ANTACID 500 MG PO CHEW: 2.0000 | CHEWABLE_TABLET | ORAL | Status: DC | PRN

## 2012-05-20 MED ORDER — SODIUM CHLORIDE 0.9 % IJ SOLN: 3.0000 mL | Freq: Two times a day (BID) | INTRAMUSCULAR | Status: DC

## 2012-05-20 MED ORDER — SODIUM CHLORIDE 0.9 % IJ SOLN: 3.0000 mL | INTRAMUSCULAR | Status: DC | PRN

## 2012-05-20 MED ORDER — ACETAMINOPHEN 325 MG PO TABS: 650.0000 mg | ORAL_TABLET | ORAL | Status: DC | PRN

## 2012-05-20 NOTE — MAU Note (Signed)
Patient is in with c/o contractions that are more intense at night and when she is lying down. She denies vaginal bleeding or lof. She reports good fetal movement. She is concerned because she had 35 weeks IUFD. She gets NST twice a week. Next one on Thursday.

## 2012-05-20 NOTE — MAU Provider Note (Signed)
Chief Complaint:  Labor Eval   First Provider Initiated Contact with Patient 05/20/12 1644    HPI: Kayla Moon is a 22 y.o. G2P0100 at [redacted]w[redacted]d who presents to maternity admissions reporting Contractions ~Q15 minutes at night that have become uncomfortable and wake her. Fewer during the day. Hx Spontaneous PTL w/ IUFD last year. Very nervous. Normal FM today. Pt is in antenatal testing. Last growth [place] 2 weeks ago pr pt. Denies leakage of fluid or vaginal bleeding.  Past Medical History: Past Medical History  Diagnosis Date  . History of stillbirth 2013    Past obstetric history: OB History   Grav Para Term Preterm Abortions TAB SAB Ect Mult Living   2 1 0 1 0 0 0 0 0 0      # Outc Date GA Lbr Len/2nd Wgt Sex Del Anes PTL Lv   1 PRE 7/13 [redacted]w[redacted]d 06:38 / 00:31 2.92kg(6lb7oz) M SVD EPI  SB   Comments: none   2 CUR               Past Surgical History: Past Surgical History  Procedure Laterality Date  . Wisdom tooth extraction      Family History: Family History  Problem Relation Age of Onset  . Other Neg Hx   . Cancer Maternal Grandfather     Social History: History  Substance Use Topics  . Smoking status: Never Smoker   . Smokeless tobacco: Never Used  . Alcohol Use: No    Allergies: No Known Allergies  Meds:  Prescriptions prior to admission  Medication Sig Dispense Refill  . cyclobenzaprine (FLEXERIL) 10 MG tablet Take 1 tablet (10 mg total) by mouth 2 (two) times daily as needed for muscle spasms.  20 tablet  0  . ferrous sulfate 325 (65 FE) MG tablet Take 325 mg by mouth daily.      . hydroxyprogesterone caproate (DELALUTIN) 250 mg/mL OIL Inject 250 mg into the muscle once.      . Prenatal Vit-Fe Fumarate-FA (PRENATAL MULTIVITAMIN) TABS Take 1 tablet by mouth daily.        ROS: Pertinent findings in history of present illness.  Physical Exam  Blood pressure 110/69, pulse 107, temperature 98.3 F (36.8 C), temperature source Oral, resp. rate 18, height 5' 4.5"  (1.638 m), weight 80.831 kg (178 lb 3.2 oz), last menstrual period 09/21/2011, SpO2 99.00%. GENERAL: Well-developed, well-nourished female in no acute distress. Anxious.  HEENT: normocephalic HEART: normal rate RESP: normal effort ABDOMEN: Soft, non-tender, gravid appropriate for gestational age EXTREMITIES: Nontender, no edema NEURO: alert and oriented SPECULUM EXAM: NEFG, physiologic discharge, no blood, cervix clean    FHT:  Baseline 140 , moderate variability, accelerations present, one variable decelerations Contractions: irreg   Labs: Results for orders placed during the hospital encounter of 05/20/12 (from the past 24 hour(s))  URINALYSIS, ROUTINE W REFLEX MICROSCOPIC     Status: Abnormal   Collection Time    05/20/12  3:45 PM      Result Value Range   Color, Urine YELLOW  YELLOW   APPearance CLEAR  CLEAR   Specific Gravity, Urine 1.015  1.005 - 1.030   pH 6.5  5.0 - 8.0   Glucose, UA NEGATIVE  NEGATIVE mg/dL   Hgb urine dipstick NEGATIVE  NEGATIVE   Bilirubin Urine NEGATIVE  NEGATIVE   Ketones, ur NEGATIVE  NEGATIVE mg/dL   Protein, ur NEGATIVE  NEGATIVE mg/dL   Urobilinogen, UA 0.2  0.0 - 1.0 mg/dL   Nitrite NEGATIVE  NEGATIVE   Leukocytes, UA TRACE (*) NEGATIVE  URINE MICROSCOPIC-ADD ON     Status: Abnormal   Collection Time    05/20/12  3:45 PM      Result Value Range   Squamous Epithelial / LPF FEW (*) RARE   WBC, UA 0-2  <3 WBC/hpf  GROUP B STREP BY PCR     Status: None   Collection Time    05/20/12  7:15 PM      Result Value Range   Group B strep by PCR NEGATIVE  NEGATIVE    Imaging:   MAU Course: 1720: Dr.Meisinger notified of contractions, VE, FHR tracing including variable, Hx IUFD. Observe x 1 hour. Recheck. If no change and FHR category I, D/C home.   1848 4+/60/-3, very soft. Dr. Jackelyn Knife notified. LR bolus. Will admit if further cervical change. D/C if no change.   2111: No further cervical change. Planned to D/C, but pt extremely  uncomfortable going home due to cervical change/dilation and being same gestational age as IUFD last year. Informed Dr. Jackelyn Knife. Will Obs tonight.   Assessment: 1. Preterm labor without delivery, third trimester    Plan: Observe on Antenatal per Dr. Jackelyn Knife.  [place], [place] 05/20/2012 4:15 PM

## 2012-05-20 NOTE — MAU Note (Signed)
Patient states she has been having contractions that keep her awake at night. Only has about 4 contractions in the daytime. Denies bleeding or leaking and reports good fetal movement.

## 2012-05-21 ENCOUNTER — Encounter (HOSPITAL_COMMUNITY): Payer: Self-pay

## 2012-05-21 ENCOUNTER — Inpatient Hospital Stay (HOSPITAL_COMMUNITY)
Admission: AD | Admit: 2012-05-21 | Discharge: 2012-05-22 | Disposition: A | Discharge status: Home / Self Care | Payer: Medicaid Other | Source: Ambulatory Visit | Attending: Obstetrics and Gynecology | Admitting: Obstetrics and Gynecology

## 2012-05-21 DIAGNOSIS — N949 Unspecified condition associated with female genital organs and menstrual cycle: Secondary | ICD-10-CM

## 2012-05-21 DIAGNOSIS — R109 Unspecified abdominal pain: Secondary | ICD-10-CM | POA: Insufficient documentation

## 2012-05-21 DIAGNOSIS — O4700 False labor before 37 completed weeks of gestation, unspecified trimester: Secondary | ICD-10-CM | POA: Diagnosis present

## 2012-05-21 DIAGNOSIS — O99891 Other specified diseases and conditions complicating pregnancy: Secondary | ICD-10-CM | POA: Insufficient documentation

## 2012-05-21 LAB — URINALYSIS, ROUTINE W REFLEX MICROSCOPIC
Glucose, UA: NEGATIVE mg/dL
Leukocytes, UA: NEGATIVE
Nitrite: NEGATIVE
Specific Gravity, Urine: <1.005 — ABNORMAL LOW (ref 1.005–1.030)
pH: 6.5 (ref 5.0–8.0)

## 2012-05-21 NOTE — MAU Note (Signed)
Sharp pains in lower abdomen onset recently, denies vaginal bleeding or discharge was put on bedrest for contractions, no meds.

## 2012-05-21 NOTE — H&P (Signed)
Chief Complaint: Labor Eval   First Provider Initiated Contact with Patient 05/20/12 1644  HPI: Kayla Moon is a 22 y.o. G2P0100 at [redacted]w[redacted]d who presents to maternity admissions reporting Contractions ~Q15 minutes at night that have become uncomfortable and wake her. Fewer during the day. Hx Spontaneous PTL w/ IUFD last year. Very nervous. Normal FM today. Pt is in antenatal testing. Last growth [place] 2 weeks ago pr pt. Denies leakage of fluid or vaginal bleeding.  Past Medical History:  Past Medical History   Diagnosis  Date   .  History of stillbirth  2013   Past obstetric history:  OB History    Grav  Para  Term  Preterm  Abortions  TAB  SAB  Ect  Mult  Living    2  1  0  1  0  0  0  0  0  0      #  Outc  Date  GA  Lbr Len/2nd  Wgt  Sex  Del  Anes  PTL  Lv    1  PRE  7/13  [redacted]w[redacted]d  06:38 / 00:31  2.92kg(6lb7oz)  M  SVD  EPI   SB    Comments: none    2  CUR               Past Surgical History:  Past Surgical History   Procedure  Laterality  Date   .  Wisdom tooth extraction      Family History:  Family History   Problem  Relation  Age of Onset   .  Other  Neg Hx    .  Cancer  Maternal Grandfather     Social History:  History   Substance Use Topics   .  Smoking status:  Never Smoker   .  Smokeless tobacco:  Never Used   .  Alcohol Use:  No    Allergies: No Known Allergies  Meds:  Prescriptions prior to admission   Medication  Sig  Dispense  Refill   .  cyclobenzaprine (FLEXERIL) 10 MG tablet  Take 1 tablet (10 mg total) by mouth 2 (two) times daily as needed for muscle spasms.  20 tablet  0   .  ferrous sulfate 325 (65 FE) MG tablet  Take 325 mg by mouth daily.     .  hydroxyprogesterone caproate (DELALUTIN) 250 mg/mL OIL  Inject 250 mg into the muscle once.     .  Prenatal Vit-Fe Fumarate-FA (PRENATAL MULTIVITAMIN) TABS  Take 1 tablet by mouth daily.      ROS: Pertinent findings in history of present illness.  Physical Exam   Blood pressure 110/69, pulse 107,  temperature 98.3 F (36.8 C), temperature source Oral, resp. rate 18, height 5' 4.5" (1.638 m), weight 80.831 kg (178 lb 3.2 oz), last menstrual period 09/21/2011, SpO2 99.00%.  GENERAL: Well-developed, well-nourished female in no acute distress. Anxious.  HEENT: normocephalic  HEART: normal rate  RESP: normal effort  ABDOMEN: Soft, non-tender, gravid appropriate for gestational age  EXTREMITIES: Nontender, no edema  NEURO: alert and oriented  SPECULUM EXAM: NEFG, physiologic discharge, no blood, cervix clean   FHT: Baseline 140 , moderate variability, accelerations present, one variable decelerations  Contractions: irreg  Labs:  Results for orders placed during the hospital encounter of 05/20/12 (from the past 24 hour(s))   URINALYSIS, ROUTINE W REFLEX MICROSCOPIC Status: Abnormal    Collection Time    05/20/12 3:45 PM   Result  Value  Range    Color, Urine  YELLOW  YELLOW    APPearance  CLEAR  CLEAR    Specific Gravity, Urine  1.015  1.005 - 1.030    pH  6.5  5.0 - 8.0    Glucose, UA  NEGATIVE  NEGATIVE mg/dL    Hgb urine dipstick  NEGATIVE  NEGATIVE    Bilirubin Urine  NEGATIVE  NEGATIVE    Ketones, ur  NEGATIVE  NEGATIVE mg/dL    Protein, ur  NEGATIVE  NEGATIVE mg/dL    Urobilinogen, UA  0.2  0.0 - 1.0 mg/dL    Nitrite  NEGATIVE  NEGATIVE    Leukocytes, UA  TRACE (*)  NEGATIVE   URINE MICROSCOPIC-ADD ON Status: Abnormal    Collection Time    05/20/12 3:45 PM   Result  Value  Range    Squamous Epithelial / LPF  FEW (*)  RARE    WBC, UA  0-2  <3 WBC/hpf   GROUP B STREP BY PCR Status: None    Collection Time    05/20/12 7:15 PM   Result  Value  Range    Group B strep by PCR  NEGATIVE  NEGATIVE    Imaging:  MAU Course:  1720: Dr.Dellene Mcgroarty notified of contractions, VE, FHR tracing including variable, Hx IUFD. Observe x 1 hour. Recheck. If no change and FHR category I, D/C home.  1848 4+/60/-3, very soft. Dr. Jackelyn Knife notified. LR bolus. Will admit if further cervical  change. D/C if no change.  2111: No further cervical change. Planned to D/C, but pt extremely uncomfortable going home due to cervical change/dilation and being same gestational age as IUFD last year. Informed Dr. Jackelyn Knife. Will Obs tonight.  Assessment:  1.  Preterm labor without delivery, third trimester    Plan:  Observe on Antenatal per Dr. Jackelyn Knife.

## 2012-05-21 NOTE — Progress Notes (Signed)
Post discharge review completed. 

## 2012-05-21 NOTE — Progress Notes (Signed)
HD #2, [redacted]W[redacted]D, preterm ctx, h/o IUFD and PTD Feels okj, ctx have spaced out Afeb, VSS FHT- Cat I, irreg ctx VE- 4/70/-2, vtx Rapid GBS neg Discussed that she has had a prolonged course of observation.  Cervix with no significant change overnight and ctx have spaced out, so not in labor.  Advised that even though she is not in labor now, she could go into labor at any time.  Prolonged fetal monitoring reactive.  She is anxious but agreeable to discharge.

## 2012-05-22 DIAGNOSIS — N949 Unspecified condition associated with female genital organs and menstrual cycle: Secondary | ICD-10-CM

## 2012-05-22 NOTE — MAU Provider Note (Signed)
Chief Complaint:  Abdominal Pain   First Provider Initiated Contact with Patient 05/22/12 0007     HPI: Kayla Moon is a 22 y.o. G2P0100 at [redacted]w[redacted]d who presents to maternity admissions reporting intermittent sharp low abd/SP pain today. Not necessarily coinciding w/ UC's. Was observed over night last night for UC's and D/C'd this am. No change in frequency of contractions since discharge. Slightly stronger. Denies leakage of fluid or vaginal bleeding. Good fetal movement. Hx 35 week IUFD last year. Very nervous.   Past Medical History: Past Medical History  Diagnosis Date  . No pertinent past medical history   . Medical history non-contributory   . History of stillbirth 2013    Past obstetric history:     OB History   Grav Para Term Preterm Abortions TAB SAB Ect Mult Living   2 1 0 1 0 0 0 0 0 0      # Outc Date GA Lbr Len/2nd Wgt Sex Del Anes PTL Lv   1 PRE 7/13 [redacted]w[redacted]d 06:38 / 00:31 2.92kg(6lb7oz) M SVD EPI  SB   Comments: none   2 CUR               Past Surgical History: Past Surgical History  Procedure Laterality Date  . Wisdom tooth extraction      Family History: Family History  Problem Relation Age of Onset  . Other Neg Hx   . Cancer Maternal Grandfather     Social History: History  Substance Use Topics  . Smoking status: Never Smoker   . Smokeless tobacco: Never Used  . Alcohol Use: No    Allergies: No Known Allergies  Meds:  Prescriptions prior to admission  Medication Sig Dispense Refill  . ferrous sulfate 325 (65 FE) MG tablet Take 325 mg by mouth daily.      . hydroxyprogesterone caproate (DELALUTIN) 250 mg/mL OIL Inject 250 mg into the muscle once.      . Prenatal Vit-Fe Fumarate-FA (PRENATAL MULTIVITAMIN) TABS Take 1 tablet by mouth daily.        ROS: Neg for fever, chills, urinary complaints, GI complaints.  Physical Exam  Blood pressure 118/64, pulse 88, temperature 98 F (36.7 C), resp. rate 16, height 5' 4.5" (1.638 m), weight 82.555 kg  (182 lb), last menstrual period 09/21/2011. GENERAL: Well-developed, well-nourished female in no acute distress.  HEENT: normocephalic HEART: normal rate RESP: normal effort ABDOMEN: Soft, non-tender, gravid appropriate for gestational age EXTREMITIES: Nontender, no edema NEURO: alert and oriented SPECULUM EXAM: NEFG, physiologic discharge, no blood, cervix clean Dilation: 4.5 Effacement (%): 60 Cervical Position: Posterior Station: -2 Presentation: Vertex engaged.  Exam by:: Dorathy Kinsman CNM  FHT:  Baseline 120-130 , moderate variability, accelerations present, no decelerations Contractions: irreg, moderate   Labs: Results for orders placed during the hospital encounter of 05/21/12 (from the past 24 hour(s))  URINALYSIS, ROUTINE W REFLEX MICROSCOPIC     Status: Abnormal   Collection Time    05/21/12 11:40 PM      Result Value Range   Color, Urine YELLOW  YELLOW   APPearance CLEAR  CLEAR   Specific Gravity, Urine <1.005 (*) 1.005 - 1.030   pH 6.5  5.0 - 8.0   Glucose, UA NEGATIVE  NEGATIVE mg/dL   Hgb urine dipstick NEGATIVE  NEGATIVE   Bilirubin Urine NEGATIVE  NEGATIVE   Ketones, ur NEGATIVE  NEGATIVE mg/dL   Protein, ur NEGATIVE  NEGATIVE mg/dL   Urobilinogen, UA 0.2  0.0 - 1.0 mg/dL  Nitrite NEGATIVE  NEGATIVE   Leukocytes, UA NEGATIVE  NEGATIVE    Imaging:  NA  MAU Course: Minimal pain while in MAU. Minimal change from last exam.   Assessment: 1. Round ligament pain vs SP pain   Plan: Discharge home Labor precautions and fetal kick counts Pt reassured by FHR tracing. Comfort measures.  Has Flexeril at home to take at night.    Medication List    TAKE these medications       ferrous sulfate 325 (65 FE) MG tablet  Take 325 mg by mouth daily.     hydroxyprogesterone caproate 250 mg/mL Oil  Commonly known as:  DELALUTIN  Inject 250 mg into the muscle once.     prenatal multivitamin Tabs  Take 1 tablet by mouth daily.        [place],  CNM 05/22/2012 12:56 AM

## 2012-05-29 NOTE — Discharge Summary (Signed)
Physician Discharge Summary  Patient ID: Kayla Moon MRN: [id number] DOB/AGE: Nov 02, 1990 21 y.o.  Admit date: 05/20/2012 Discharge date: 05/21/2012  Admission Diagnoses:  IUP at 35 weeks, previous IUFD, preterm contractions  Discharge Diagnoses:  Same Active Problems:   Preterm uterine contractions, antepartum   Discharged Condition: good  Hospital Course: Ptseen in MAU with irreg ctx, no significant cervical change, but she did not feel comfortable going home with h/o IUFD at around this time of pregnancy.  Admitted for observation.  Overnight contractions spaced out, FHT reactive, stable for d/c home am of 4-23   Discharge Exam: Blood pressure 108/49, pulse 102, temperature 98 F (36.7 C), temperature source Oral, resp. rate 18, height 5' 4.5" (1.638 m), weight 80.831 kg (178 lb 3.2 oz), last menstrual period 09/21/2011, SpO2 99.00%. General appearance: alert  Disposition: 01-Home or Self Care  Discharge Orders   Future Orders Complete By Expires     Discharge activity:  No Restrictions  As directed     Discharge diet:  No restrictions  As directed     Fetal Kick Count:  Lie on our left side for one hour after a meal, and count the number of times your baby kicks.  If it is less than 5 times, get up, move around and drink some juice.  Repeat the test 30 minutes later.  If it is still less than 5 kicks in an hour, notify your doctor.  As directed     LABOR:  When conractions begin, you should start to time them from the beginning of one contraction to the beginning  of the next.  When contractions are 5 - 10 minutes apart or less and have been regular for at least an hour, you should call your health care provider.  As directed     Notify physician for bleeding from the vagina  As directed     Notify physician for blurring of vision or spots before the eyes  As directed     Notify physician for chills or fever  As directed     Notify physician for fainting spells, "black outs" or  loss of consciousness  As directed     Notify physician for increase in vaginal discharge  As directed     Notify physician for leaking of fluid  As directed     Notify physician for pain or burning when urinating  As directed     Notify physician for pelvic pressure (sudden increase)  As directed     Notify physician for severe or continued nausea or vomiting  As directed     Notify physician for sudden gushing of fluid from the vagina (with or without continued leaking)  As directed     Notify physician for sudden, constant, or occasional abdominal pain  As directed     Notify physician if baby moving less than usual  As directed         Medication List    STOP taking these medications       cyclobenzaprine 10 MG tablet  Commonly known as:  FLEXERIL      TAKE these medications       ferrous sulfate 325 (65 FE) MG tablet  Take 325 mg by mouth daily.     hydroxyprogesterone caproate 250 mg/mL Oil  Commonly known as:  DELALUTIN  Inject 250 mg into the muscle once.     prenatal multivitamin Tabs  Take 1 tablet by mouth daily.  Follow-up Information   Follow up with Telecia Larocque D, MD. (as scheduled)    Contact information:   [address], [address] [place] [address] [phone]       Follow up with [facility] [place] [facility]. (As needed if symptoms worsen)    Contact information:   [address] [place] [address] [phone]      Signed: Zenaida Niece 05/29/2012, 5:05 PM

## 2012-06-03 ENCOUNTER — Encounter (HOSPITAL_COMMUNITY): Payer: Self-pay | Admitting: *Deleted

## 2012-06-03 ENCOUNTER — Inpatient Hospital Stay (HOSPITAL_COMMUNITY)
Admission: AD | Admit: 2012-06-03 | Discharge: 2012-06-05 | DRG: 775 | Disposition: A | Discharge status: Home / Self Care | Payer: Medicaid Other | Source: Ambulatory Visit | Attending: Obstetrics and Gynecology | Admitting: Obstetrics and Gynecology

## 2012-06-03 DIAGNOSIS — Z8759 Personal history of other complications of pregnancy, childbirth and the puerperium: Secondary | ICD-10-CM

## 2012-06-03 DIAGNOSIS — O4700 False labor before 37 completed weeks of gestation, unspecified trimester: Secondary | ICD-10-CM

## 2012-06-03 LAB — CBC
MCH: 25.6 pg — ABNORMAL LOW (ref 26.0–34.0)
Platelets: 127 10*3/uL — ABNORMAL LOW (ref 150–400)
RBC: 4.49 MIL/uL (ref 3.87–5.11)
RDW: 14.7 % (ref 11.5–15.5)

## 2012-06-03 LAB — OB RESULTS CONSOLE HIV ANTIBODY (ROUTINE TESTING): HIV: NONREACTIVE

## 2012-06-03 LAB — RPR: RPR Ser Ql: NONREACTIVE

## 2012-06-03 MED ORDER — SIMETHICONE 80 MG PO CHEW: 80.0000 mg | CHEWABLE_TABLET | ORAL | Status: DC | PRN

## 2012-06-03 MED ORDER — EPHEDRINE 5 MG/ML INJ
10.0000 mg | INTRAVENOUS | Status: DC | PRN
  Filled 2012-06-03: qty 2

## 2012-06-03 MED ORDER — OXYTOCIN 40 UNITS IN LACTATED RINGERS INFUSION - SIMPLE MED: 62.5000 mL/h | INTRAVENOUS | Status: DC

## 2012-06-03 MED ORDER — PRENATAL MULTIVITAMIN CH
1.0000 | ORAL_TABLET | Freq: Every day | ORAL | Status: DC
  Administered 2012-06-04 – 2012-06-05 (×2): 1 via ORAL
  Filled 2012-06-03 (×2): qty 1

## 2012-06-03 MED ORDER — LACTATED RINGERS IV SOLN: 500.0000 mL | INTRAVENOUS | Status: DC | PRN

## 2012-06-03 MED ORDER — OXYTOCIN BOLUS FROM INFUSION
500.0000 mL | INTRAVENOUS | Status: DC
  Administered 2012-06-03: 500 mL via INTRAVENOUS

## 2012-06-03 MED ORDER — ONDANSETRON HCL 4 MG/2ML IJ SOLN: 4.0000 mg | Freq: Four times a day (QID) | INTRAMUSCULAR | Status: DC | PRN

## 2012-06-03 MED ORDER — LACTATED RINGERS IV SOLN: 500.0000 mL | Freq: Once | INTRAVENOUS | Status: DC

## 2012-06-03 MED ORDER — LACTATED RINGERS IV SOLN
INTRAVENOUS | Status: DC
  Administered 2012-06-03: 10:00:00 via INTRAVENOUS

## 2012-06-03 MED ORDER — BENZOCAINE-MENTHOL 20-0.5 % EX AERO
1.0000 "application " | INHALATION_SPRAY | CUTANEOUS | Status: DC | PRN
  Administered 2012-06-03: 1 via TOPICAL
  Filled 2012-06-03: qty 56

## 2012-06-03 MED ORDER — ZOLPIDEM TARTRATE 5 MG PO TABS: 5.0000 mg | ORAL_TABLET | Freq: Every evening | ORAL | Status: DC | PRN

## 2012-06-03 MED ORDER — BUTORPHANOL TARTRATE 1 MG/ML IJ SOLN
1.0000 mg | INTRAMUSCULAR | Status: DC | PRN
Start: 2012-06-03 — End: 2012-06-03
  Administered 2012-06-03: 1 mg via INTRAVENOUS
  Filled 2012-06-03: qty 1

## 2012-06-03 MED ORDER — TERBUTALINE SULFATE 1 MG/ML IJ SOLN: 0.2500 mg | Freq: Once | INTRAMUSCULAR | Status: DC | PRN

## 2012-06-03 MED ORDER — LIDOCAINE HCL (PF) 1 % IJ SOLN
30.0000 mL | INTRAMUSCULAR | Status: DC | PRN
  Filled 2012-06-03 (×2): qty 30

## 2012-06-03 MED ORDER — PHENYLEPHRINE 40 MCG/ML (10ML) SYRINGE FOR IV PUSH (FOR BLOOD PRESSURE SUPPORT)
80.0000 ug | PREFILLED_SYRINGE | INTRAVENOUS | Status: DC | PRN
  Filled 2012-06-03: qty 2

## 2012-06-03 MED ORDER — FENTANYL 2.5 MCG/ML BUPIVACAINE 1/10 % EPIDURAL INFUSION (WH - ANES): 14.0000 mL/h | INTRAMUSCULAR | Status: DC | PRN

## 2012-06-03 MED ORDER — SENNOSIDES-DOCUSATE SODIUM 8.6-50 MG PO TABS
2.0000 | ORAL_TABLET | Freq: Every day | ORAL | Status: DC
  Administered 2012-06-03 – 2012-06-04 (×2): 2 via ORAL

## 2012-06-03 MED ORDER — OXYTOCIN 40 UNITS IN LACTATED RINGERS INFUSION - SIMPLE MED: 1.0000 m[IU]/min | INTRAVENOUS | Status: DC

## 2012-06-03 MED ORDER — IBUPROFEN 600 MG PO TABS: 600.0000 mg | ORAL_TABLET | Freq: Four times a day (QID) | ORAL | Status: DC | PRN

## 2012-06-03 MED ORDER — OXYCODONE-ACETAMINOPHEN 5-325 MG PO TABS
1.0000 | ORAL_TABLET | ORAL | Status: DC | PRN
  Administered 2012-06-03 – 2012-06-04 (×2): 1 via ORAL
  Filled 2012-06-03 (×2): qty 1

## 2012-06-03 MED ORDER — TETANUS-DIPHTH-ACELL PERTUSSIS 5-2.5-18.5 LF-MCG/0.5 IM SUSP: 0.5000 mL | Freq: Once | INTRAMUSCULAR | Status: DC

## 2012-06-03 MED ORDER — ONDANSETRON HCL 4 MG/2ML IJ SOLN: 4.0000 mg | INTRAMUSCULAR | Status: DC | PRN

## 2012-06-03 MED ORDER — ONDANSETRON HCL 4 MG PO TABS: 4.0000 mg | ORAL_TABLET | ORAL | Status: DC | PRN

## 2012-06-03 MED ORDER — DIPHENHYDRAMINE HCL 25 MG PO CAPS: 25.0000 mg | ORAL_CAPSULE | Freq: Four times a day (QID) | ORAL | Status: DC | PRN

## 2012-06-03 MED ORDER — ACETAMINOPHEN 325 MG PO TABS: 650.0000 mg | ORAL_TABLET | ORAL | Status: DC | PRN

## 2012-06-03 MED ORDER — IBUPROFEN 600 MG PO TABS
600.0000 mg | ORAL_TABLET | Freq: Four times a day (QID) | ORAL | Status: DC
  Administered 2012-06-03 – 2012-06-05 (×5): 600 mg via ORAL
  Filled 2012-06-03 (×6): qty 1

## 2012-06-03 MED ORDER — OXYTOCIN 40 UNITS IN LACTATED RINGERS INFUSION - SIMPLE MED
INTRAVENOUS | Status: AC
  Administered 2012-06-03: 2 m[IU]/min via INTRAVENOUS
  Filled 2012-06-03: qty 1000

## 2012-06-03 MED ORDER — LANOLIN HYDROUS EX OINT: TOPICAL_OINTMENT | CUTANEOUS | Status: DC | PRN

## 2012-06-03 MED ORDER — CITRIC ACID-SODIUM CITRATE 334-500 MG/5ML PO SOLN: 30.0000 mL | ORAL | Status: DC | PRN

## 2012-06-03 MED ORDER — DIPHENHYDRAMINE HCL 50 MG/ML IJ SOLN: 12.5000 mg | INTRAMUSCULAR | Status: DC | PRN

## 2012-06-03 MED ORDER — WITCH HAZEL-GLYCERIN EX PADS
1.0000 "application " | MEDICATED_PAD | CUTANEOUS | Status: DC | PRN
  Administered 2012-06-04: 1 via TOPICAL

## 2012-06-03 MED ORDER — OXYCODONE-ACETAMINOPHEN 5-325 MG PO TABS: 1.0000 | ORAL_TABLET | ORAL | Status: DC | PRN

## 2012-06-03 MED ORDER — SODIUM CHLORIDE 0.9 % IJ SOLN: 3.0000 mL | INTRAMUSCULAR | Status: DC | PRN

## 2012-06-03 MED ORDER — DIBUCAINE 1 % RE OINT
1.0000 "application " | TOPICAL_OINTMENT | RECTAL | Status: DC | PRN
  Administered 2012-06-04: 1 via RECTAL
  Filled 2012-06-03: qty 28

## 2012-06-03 NOTE — H&P (Signed)
Kayla Moon is a 22 y.o. female G2P0100 at  36 6/7 weeks (EDD 06/25/12 by LMP c/w 6 week [place])  presenting for contractions and cervical change to 5 cm.  Prenatal care complicated by prior IUFD at 35+ weeks when patient presented in labor ata 35 weeks and found to have no FHR.  Workup revealed no clear etiology and FHR had been reactive two days prior to admission.  Pt received delalutin this pregnancy weeks 15-35, and has had dilation for several weeks to 3-4 cm.  Now with contractions and progression to 5+cm.  No other risk factors.  Pt has been followed by twice weekly NST's and Dopplers weekly.    Maternal Medical History:  Reason for admission: Contractions.   Contractions: Onset was 1-2 hours ago.   Frequency: regular.   Perceived severity is moderate.    Fetal activity: Perceived fetal activity is normal.    Prenatal Complications - Diabetes: none.    OB History   Grav Para Term Preterm Abortions TAB SAB Ect Mult Living   2 1 0 1 0 0 0 0 0 0     7/13 NSVD 6#7oz stillbirth  Past Medical History  Diagnosis Date  . No pertinent past medical history   . Medical history non-contributory   . History of stillbirth 2013   Past Surgical History  Procedure Laterality Date  . Wisdom tooth extraction     Family History: family history includes Cancer in her maternal grandfather.  There is no history of Other. Social History:  reports that she has never smoked. She has never used smokeless tobacco. She reports that she does not drink alcohol or use illicit drugs.   Prenatal Transfer Tool  Maternal Diabetes: No Genetic Screening: Normal Maternal Ultrasounds/Referrals: Normal (isolated EIF) Fetal Ultrasounds or other Referrals:  None Maternal Substance Abuse:  No Significant Maternal Medications:  None Significant Maternal Lab Results:  None Other Comments:  None  ROS    Last menstrual period 09/21/2011. Exam Physical Exam  Prenatal labs: ABO, Rh: --/--/O POS, O POS (07/13  0041) Antibody: NEG (07/13 0041) Rubella: 38.2 (07/13 1610) RPR: NON REACTIVE (04/22 2115)  HBsAg:   Neg HIV:   Neg GBS:   Neg First trimester screen and AFP WNL One hour GTT 118 Hgb AA  Assessment/Plan: Pt admitted in labor with cervical change to 5cm.  Unsure if wants epidural.  Will follow progress, AROM after admission.  Oliver Pila 06/03/2012, 10:21 AM

## 2012-06-04 LAB — CBC
HCT: 33.3 % — ABNORMAL LOW (ref 36.0–46.0)
MCV: 79.7 fL (ref 78.0–100.0)
RBC: 4.18 MIL/uL (ref 3.87–5.11)
RDW: 14.7 % (ref 11.5–15.5)
WBC: 14.2 10*3/uL — ABNORMAL HIGH (ref 4.0–10.5)

## 2012-06-04 NOTE — Progress Notes (Signed)
Post Partum Day 1 Subjective:   Pt in shower but per partner doing great  Objective: Blood pressure 101/61, pulse 66, temperature 98 F (36.7 C), temperature source Oral, resp. rate 18, height 5\' 6"  (1.676 m), weight 80.468 kg (177 lb 6.4 oz), last menstrual period 09/21/2011, SpO2 99.00%, unknown if currently breastfeeding.  Physical Exam:  General: alert and cooperative Lochia: appropriate Uterine Fundus: firm    Recent Labs  06/03/12 1020 06/04/12 0555  HGB 11.5* 10.6*  HCT 36.0 33.3*    Assessment/Plan: Plan for discharge tomorrow   LOS: 1 day   Kayla Moon W 06/04/2012, 9:17 AM

## 2012-06-04 NOTE — Progress Notes (Signed)
Patient found asleep with infant on chest. Instructed Patient to place infant in bassinet when feeling tired or sleepy due to possible injury to infant from accidental fall.

## 2012-06-04 NOTE — Progress Notes (Signed)
Ur chart review completed.  

## 2012-06-05 MED ORDER — IBUPROFEN 600 MG PO TABS: 600.0000 mg | ORAL_TABLET | Freq: Four times a day (QID) | ORAL | Status: DC

## 2012-06-05 MED ORDER — OXYCODONE-ACETAMINOPHEN 5-325 MG PO TABS: 1.0000 | ORAL_TABLET | ORAL | Status: DC | PRN

## 2012-06-05 NOTE — Discharge Summary (Signed)
Obstetric Discharge Summary Reason for Admission: onset of labor Prenatal Procedures: NST and ultrasound Intrapartum Procedures: spontaneous vaginal delivery Postpartum Procedures: none Complications-Operative and Postpartum: none Hemoglobin  Date Value Range Status  06/04/2012 10.6* 12.0 - 15.0 g/dL Final     HCT  Date Value Range Status  06/04/2012 33.3* 36.0 - 46.0 % Final    Physical Exam:  General: alert and cooperative Lochia: appropriate Uterine Fundus: firm   Discharge Diagnoses: Pregnancy delivered at 36 6/7 weks                                         H/o IUFD at 35 weeks of unknown etiology Discharge Information: Date: 06/05/2012 Activity: pelvic rest Diet: routine Medications: Ibuprofen and percocet Condition: improved Instructions: refer to practice specific booklet Discharge to: home Follow-up Information   Follow up with Kayla Pila, MD. Schedule an appointment as soon as possible for a visit in 6 weeks. (postpartum exam)    Contact information:   [address], [address] [place] [address] [phone]       Newborn Data: Live born female  Birth Weight: 6 lb 8.9 oz (2975 g) APGAR: 8, 9  Home with mother.  Kayla Moon 06/05/2012, 6:15 AM

## 2012-06-05 NOTE — Progress Notes (Signed)
Post Partum Day 2 Subjective: no complaints and up ad lib  Objective: Blood pressure 111/55, pulse 64, temperature 98.2 F (36.8 C), temperature source Oral, resp. rate 18, height 5\' 6"  (1.676 m), weight 80.468 kg (177 lb 6.4 oz), last menstrual period 09/21/2011, SpO2 100.00%, unknown if currently breastfeeding.  Physical Exam:  General: alert and cooperative Lochia: appropriate Uterine Fundus: firm   Recent Labs  06/03/12 1020 06/04/12 0555  HGB 11.5* 10.6*  HCT 36.0 33.3*    Assessment/Plan: Discharge home Motrin and percocet   LOS: 2 days   Twyla Dais W 06/05/2012, 8:52 AM

## 2012-06-11 ENCOUNTER — Ambulatory Visit (HOSPITAL_COMMUNITY): Admit: 2012-06-11 | Payer: Medicaid Other

## 2012-10-13 ENCOUNTER — Emergency Department (HOSPITAL_BASED_OUTPATIENT_CLINIC_OR_DEPARTMENT_OTHER)
Admission: EM | Admit: 2012-10-13 | Discharge: 2012-10-13 | Disposition: A | Discharge status: Home / Self Care | Payer: Medicaid Other | Attending: Emergency Medicine | Admitting: Emergency Medicine

## 2012-10-13 ENCOUNTER — Encounter (HOSPITAL_BASED_OUTPATIENT_CLINIC_OR_DEPARTMENT_OTHER): Payer: Self-pay | Admitting: *Deleted

## 2012-10-13 DIAGNOSIS — Z79899 Other long term (current) drug therapy: Secondary | ICD-10-CM | POA: Insufficient documentation

## 2012-10-13 DIAGNOSIS — N938 Other specified abnormal uterine and vaginal bleeding: Secondary | ICD-10-CM | POA: Insufficient documentation

## 2012-10-13 DIAGNOSIS — R109 Unspecified abdominal pain: Secondary | ICD-10-CM | POA: Insufficient documentation

## 2012-10-13 DIAGNOSIS — Z3202 Encounter for pregnancy test, result negative: Secondary | ICD-10-CM | POA: Insufficient documentation

## 2012-10-13 DIAGNOSIS — N949 Unspecified condition associated with female genital organs and menstrual cycle: Secondary | ICD-10-CM | POA: Insufficient documentation

## 2012-10-13 LAB — CBC
HCT: 40.2 % (ref 36.0–46.0)
Hemoglobin: 13.7 g/dL (ref 12.0–15.0)
MCV: 85 fL (ref 78.0–100.0)
RBC: 4.73 MIL/uL (ref 3.87–5.11)
WBC: 6.6 10*3/uL (ref 4.0–10.5)

## 2012-10-13 LAB — HIV ANTIBODY (ROUTINE TESTING W REFLEX): HIV: NONREACTIVE

## 2012-10-13 LAB — URINALYSIS, ROUTINE W REFLEX MICROSCOPIC
Bilirubin Urine: NEGATIVE
Specific Gravity, Urine: 1.028 (ref 1.005–1.030)
pH: 6 (ref 5.0–8.0)

## 2012-10-13 LAB — WET PREP, GENITAL
Clue Cells Wet Prep HPF POC: NONE SEEN
Trich, Wet Prep: NONE SEEN

## 2012-10-13 NOTE — ED Notes (Signed)
Pt amb to room 8 with quick steady gait in nad. Pt reports intermittent spotting x 2 months since having hormonal implants for birth control. Pt also reports pelvic cramping, "not as severe as with my period, but they are there.Marland KitchenMarland Kitchen"

## 2012-10-13 NOTE — ED Notes (Signed)
MD at bedside. 

## 2012-10-13 NOTE — ED Provider Notes (Addendum)
CSN: [id number]     Arrival date & time 10/13/12  1156 History   First MD Initiated Contact with Patient 10/13/12 1256     Chief Complaint  Patient presents with  . Vaginal Bleeding   (Consider location/radiation/quality/duration/timing/severity/associated sxs/prior Treatment) HPI [place] of vaginal spotting between menstrual periods for the past 2 months since she was administered hormonal implants for birth-control. Symptoms accompanied by lower abdominal cramping. She is treated herself with ibuprofen with adequate relief. Denies vaginal discharge She denies other associated symptoms. Last normal menstrual period 2 months ago.  Past Medical History  Diagnosis Date  . No pertinent past medical history   . Medical history non-contributory   . History of stillbirth 2013  G2P1011, 1 stilll born, 65 living child Past Surgical History  Procedure Laterality Date  . Wisdom tooth extraction     Family History  Problem Relation Age of Onset  . Other Neg Hx   . Cancer Maternal Grandfather    History  Substance Use Topics  . Smoking status: Never Smoker   . Smokeless tobacco: Never Used  . Alcohol Use: No   OB History   Grav Para Term Preterm Abortions TAB SAB Ect Mult Living   2 2 0 2 0 0 0 0 0 1      Review of Systems  Constitutional: Negative.   HENT: Negative.   Respiratory: Negative.   Cardiovascular: Negative.   Gastrointestinal: Positive for abdominal pain.  Genitourinary: Positive for vaginal bleeding.  Musculoskeletal: Negative.   Skin: Negative.   Neurological: Negative.   Psychiatric/Behavioral: Negative.   All other systems reviewed and are negative.    Allergies  Review of patient's allergies indicates no known allergies.  Home Medications   Current Outpatient Rx  Name  Route  Sig  Dispense  Refill  . ferrous sulfate 325 (65 FE) MG tablet   Oral   Take 325 mg by mouth daily.         Marland Kitchen ibuprofen (ADVIL,MOTRIN) 600 MG tablet   Oral   Take 1 tablet  (600 mg total) by mouth every 6 (six) hours.   30 tablet   1   . oxyCODONE-acetaminophen (PERCOCET/ROXICET) 5-325 MG per tablet   Oral   Take 1-2 tablets by mouth every 4 (four) hours as needed.   30 tablet   0   . Prenatal Vit-Fe Fumarate-FA (PRENATAL MULTIVITAMIN) TABS   Oral   Take 1 tablet by mouth daily.          BP 117/74  Pulse 63  Temp(Src) 98.3 F (36.8 C) (Oral)  Resp 18  Ht 5\' 6"  (1.676 m)  Wt 150 lb (68.04 kg)  BMI 24.22 kg/m2  SpO2 100% Physical Exam  Nursing note and vitals reviewed. Constitutional: She appears well-developed and well-nourished.  HENT:  Head: Normocephalic and atraumatic.  Eyes: Conjunctivae are normal. Pupils are equal, round, and reactive to light.  Neck: Neck supple. No tracheal deviation present. No thyromegaly present.  Cardiovascular: Normal rate and regular rhythm.   No murmur heard. Pulmonary/Chest: Effort normal and breath sounds normal.  Abdominal: Soft. Bowel sounds are normal. She exhibits no distension. There is no tenderness.  Genitourinary:  No external lesion. No discharge. Slight dark blood oozing from the cervical os. The cervical os closed. No cervical motion tenderness. No adnexal masses or tenderness.  Musculoskeletal: Normal range of motion. She exhibits no edema and no tenderness.  Neurological: She is alert. Coordination normal.  Skin: Skin is warm and dry. No rash  noted.  Psychiatric: She has a normal mood and affect.    ED Course  Procedures (including critical care time) Labs Review Labs Reviewed  PREGNANCY, URINE  URINALYSIS, ROUTINE W REFLEX MICROSCOPIC   Imaging Review No results found. To 225 PM patient resting comfortably  Results for orders placed during the hospital encounter of 10/13/12  WET PREP, GENITAL      Result Value Range   Yeast Wet Prep HPF POC NONE SEEN  NONE SEEN   Trich, Wet Prep NONE SEEN  NONE SEEN   Clue Cells Wet Prep HPF POC NONE SEEN  NONE SEEN   WBC, Wet Prep HPF POC FEW  (*) NONE SEEN  URINALYSIS, ROUTINE W REFLEX MICROSCOPIC      Result Value Range   Color, Urine YELLOW  YELLOW   APPearance CLEAR  CLEAR   Specific Gravity, Urine 1.028  1.005 - 1.030   pH 6.0  5.0 - 8.0   Glucose, UA NEGATIVE  NEGATIVE mg/dL   Hgb urine dipstick MODERATE (*) NEGATIVE   Bilirubin Urine NEGATIVE  NEGATIVE   Ketones, ur NEGATIVE  NEGATIVE mg/dL   Protein, ur NEGATIVE  NEGATIVE mg/dL   Urobilinogen, UA 0.2  0.0 - 1.0 mg/dL   Nitrite NEGATIVE  NEGATIVE   Leukocytes, UA NEGATIVE  NEGATIVE  PREGNANCY, URINE      Result Value Range   Preg Test, Ur NEGATIVE  NEGATIVE  CBC      Result Value Range   WBC 6.6  4.0 - 10.5 K/uL   RBC 4.73  3.87 - 5.11 MIL/uL   Hemoglobin 13.7  12.0 - 15.0 g/dL   HCT 96.2  95.2 - 84.1 %   MCV 85.0  78.0 - 100.0 fL   MCH 29.0  26.0 - 34.0 pg   MCHC 34.1  30.0 - 36.0 g/dL   RDW 32.4  40.1 - 02.7 %   Platelets 171  150 - 400 K/uL  URINE MICROSCOPIC-ADD ON      Result Value Range   Squamous Epithelial / LPF RARE  RARE   WBC, UA 0-2  <3 WBC/hpf   RBC / HPF 21-50  <3 RBC/hpf   Bacteria, UA FEW (*) RARE   Urine-Other MUCOUS PRESENT     No results found.  MDM  No diagnosis found. . hemmaturia may be  secondary to contamination Plan followup Dr. Senaida Ores Diagnosis #1 dysfunctional uterine bleeding     Doug Sou, MD 10/13/12 1429  Doug Sou, MD 10/13/12 1434  Doug Sou, MD 10/13/12 1436

## 2012-10-14 LAB — GC/CHLAMYDIA PROBE AMP: GC Probe RNA: NEGATIVE

## 2013-05-12 ENCOUNTER — Emergency Department (HOSPITAL_BASED_OUTPATIENT_CLINIC_OR_DEPARTMENT_OTHER)
Admission: EM | Admit: 2013-05-12 | Discharge: 2013-05-12 | Disposition: A | Discharge status: Home / Self Care | Payer: Medicaid Other | Attending: Emergency Medicine | Admitting: Emergency Medicine

## 2013-05-12 ENCOUNTER — Encounter (HOSPITAL_BASED_OUTPATIENT_CLINIC_OR_DEPARTMENT_OTHER): Payer: Self-pay | Admitting: Emergency Medicine

## 2013-05-12 DIAGNOSIS — K121 Other forms of stomatitis: Secondary | ICD-10-CM

## 2013-05-12 DIAGNOSIS — K1370 Unspecified lesions of oral mucosa: Secondary | ICD-10-CM | POA: Insufficient documentation

## 2013-05-12 MED ORDER — ACYCLOVIR 400 MG PO TABS: 400.0000 mg | ORAL_TABLET | Freq: Four times a day (QID) | ORAL | Status: DC

## 2013-05-12 NOTE — ED Notes (Signed)
Pt reports that she woke up 2 days ago with a tingling feeling in the middle of her bottom lip.  Now reports lip swelling.

## 2013-05-12 NOTE — Discharge Instructions (Signed)
Oral Ulcers °Oral ulcers are painful, shallow sores around the lining of the mouth. They can affect the gums, the inside of the lips and the cheeks (sores on the outside of the lips and on the face are different). They typically first occur in school aged children and teenagers. Oral ulcers may also be called canker sores or cold sores. °CAUSES  °Canker sores and cold sores can be caused by many factors including: °· Infection. °· Injury. °· Sun exposure. °· Medications. °· Emotional stress. °· Food allergies. °· Vitamin deficiencies. °· Toothpastes containing sodium lauryl sulfate. °The Herpes Virus can be the cause of mouth ulcers. The first infection can be severe and cause 10 or more ulcers on the gums, tongue and lips with fever and difficulty in swallowing. This infection usually occurs between the ages of 1 and 3 years.  °SYMPTOMS  °The typical sore is about ¼ inch (6 mm) in size, is an oval or round ulcer with red borders. °DIAGNOSIS  °Your caregiver can diagnose simple oral ulcers by examination. Additional testing is usually not required.  °TREATMENT  °Treatment is aimed at pain relief. Generally, oral ulcers resolve by themselves within 1 to 2 weeks without medication and are not contagious unless caused by Herpes (and other viruses). Antibiotics are not effective with mouth sores. Avoid direct contact with others until the ulcer is completely healed. See your caregiver for follow-up care as recommended. Also: °· Offer a soft diet. °· Encourage plenty of fluids to prevent dehydration. Popsicles and milk shakes can be helpful. °· Avoid acidic and salty foods and drinks such as orange juice. °· Infants and young children will often refuse to drink because of pain. Using a teaspoon, cup or syringe to give small amounts of fluids frequently can help prevent dehydration. °· Cold compresses on the face may help reduce pain. °· Pain medication can help control soreness. °· A solution of diphenhydramine mixed  with a liquid antacid can be useful to decrease the soreness of ulcers. Consult a caregiver for the dosing. °· Liquids or ointments with a numbing ingredient may be helpful when used as recommended. °· Older children and teenagers can rinse their mouth with a salt-water mixture (1/2 teaspoonof salt in 8 ounces of water) four times a day. This treatment is uncomfortable but may reduce the time the ulcers are present. °· There are many over the counter throat lozenges and medications available for oral ulcers. There effectiveness has not been studied. °· Consult your medical caregiver prior to using homeopathic treatments for oral ulcers. °SEEK MEDICAL CARE IF:  °· You think your child needs to be seen. °· The pain worsens and you cannot control it. °· There are 4 or more ulcers. °· The lips and gums begin to bleed and crust. °· A single mouth ulcer is near a tooth that is causing a toothache or pain. °· Your child has a fever, swollen face, or swollen glands. °· The ulcers began after starting a medication. °· Mouth ulcers keep re-occurring or last more than 2 weeks. °· You think your child is not taking adequate fluids. °SEEK IMMEDIATE MEDICAL CARE IF:  °· Your child has a high fever. °· Your child is unable to swallow or becomes dehydrated. °· Your child looks or acts very ill. °· An ulcer caused by a chemical your child accidentally put in their mouth. °Document Released: 02/23/2004 Document Revised: 04/09/2011 Document Reviewed: 10/07/2008 °ExitCare® Patient Information ©2014 [employer]. ° °

## 2013-05-12 NOTE — ED Provider Notes (Signed)
CSN: [id number]     Arrival date & time 05/12/13  1553 History   First MD Initiated Contact with Patient 05/12/13 1736     Chief Complaint  Patient presents with  . Mouth Lesions     (Consider location/radiation/quality/duration/timing/severity/associated sxs/prior Treatment) Patient is a 23 y.o. female presenting with mouth sores. The history is provided by the patient. No language interpreter was used.  Mouth Lesions Location:  Lower lip Onset quality:  Gradual Severity:  Moderate Duration:  2 days Progression:  Worsening Chronicity:  New Relieved by:  Nothing Ineffective treatments:  None tried   Past Medical History  Diagnosis Date  . No pertinent past medical history   . Medical history non-contributory   . History of stillbirth 2013   Past Surgical History  Procedure Laterality Date  . Wisdom tooth extraction     Family History  Problem Relation Age of Onset  . Other Neg Hx   . Cancer Maternal Grandfather    History  Substance Use Topics  . Smoking status: Never Smoker   . Smokeless tobacco: Never Used  . Alcohol Use: No   OB History   Grav Para Term Preterm Abortions TAB SAB Ect Mult Living   2 2 0 2 0 0 0 0 0 1      Review of Systems  HENT: Positive for mouth sores.   All other systems reviewed and are negative.     Allergies  Review of patient's allergies indicates no known allergies.  Home Medications   Prior to Admission medications   Not on File   BP 113/68  Pulse 77  Temp(Src) 98.8 F (37.1 C) (Oral)  Resp 16  Ht 5\' 6"  (1.676 m)  Wt 156 lb (70.761 kg)  BMI 25.19 kg/m2  SpO2 100% Physical Exam  Nursing note and vitals reviewed. Constitutional: She is oriented to person, place, and time. She appears well-developed and well-nourished.  HENT:  Head: Normocephalic and atraumatic.  Dry healing sore lower lip,  Erythema around area  Eyes: Conjunctivae and EOM are normal. Pupils are equal, round, and reactive to light.  Neck: Normal  range of motion.  Cardiovascular: Normal rate.   Pulmonary/Chest: Effort normal.  Musculoskeletal: Normal range of motion.  Neurological: She is alert and oriented to person, place, and time.  Psychiatric: She has a normal mood and affect.    ED Course  Procedures (including critical care time) Labs Review Labs Reviewed - No data to display  Imaging Review No results found.   EKG Interpretation None      MDM   Final diagnoses:  Oral ulcer    Acyclovir.   Pt advised to return if any problems.    Lonia SkinnerLeslie K Clark ForkSofia, PA-C 05/12/13 2016

## 2013-05-13 NOTE — ED Provider Notes (Signed)
Medical screening examination/treatment/procedure(s) were performed by non-physician practitioner and as supervising physician I was immediately available for consultation/collaboration.   Shanna CiscoMegan E Docherty, MD 05/13/13 [phone]

## 2013-10-26 ENCOUNTER — Encounter (HOSPITAL_BASED_OUTPATIENT_CLINIC_OR_DEPARTMENT_OTHER): Payer: Self-pay | Admitting: Emergency Medicine

## 2013-10-26 ENCOUNTER — Emergency Department (HOSPITAL_BASED_OUTPATIENT_CLINIC_OR_DEPARTMENT_OTHER)
Admission: EM | Admit: 2013-10-26 | Discharge: 2013-10-26 | Disposition: A | Discharge status: Home / Self Care | Payer: Medicaid Other | Attending: Emergency Medicine | Admitting: Emergency Medicine

## 2013-10-26 DIAGNOSIS — H6120 Impacted cerumen, unspecified ear: Secondary | ICD-10-CM | POA: Insufficient documentation

## 2013-10-26 DIAGNOSIS — H9209 Otalgia, unspecified ear: Secondary | ICD-10-CM | POA: Insufficient documentation

## 2013-10-26 NOTE — Discharge Instructions (Signed)
Cerumen Impaction °A cerumen impaction is when the wax in your ear forms a plug. This plug usually causes reduced hearing. Sometimes it also causes an earache or dizziness. Removing a cerumen impaction can be difficult and painful. The wax sticks to the ear canal. The canal is sensitive and bleeds easily. If you try to remove a heavy wax buildup with a cotton tipped swab, you may push it in further. °Irrigation with water, suction, and small ear curettes may be used to clear out the wax. If the impaction is fixed to the skin in the ear canal, ear drops may be needed for a few days to loosen the wax. People who build up a lot of wax frequently can use ear wax removal products available in your local drugstore. °SEEK MEDICAL CARE IF:  °You develop an earache, increased hearing loss, or marked dizziness. °Document Released: 02/23/2004 Document Revised: 04/09/2011 Document Reviewed: 04/14/2009 °ExitCare® Patient Information ©2015 [employer]. This information is not intended to replace advice given to you by your health care provider. Make sure you discuss any questions you have with your health care provider. ° °

## 2013-10-26 NOTE — ED Provider Notes (Signed)
Medical screening examination/treatment/procedure(s) were performed by non-physician practitioner and as supervising physician I was immediately available for consultation/collaboration.   EKG Interpretation None       Ethelda Chick, MD 10/26/13 1254

## 2013-10-26 NOTE — ED Notes (Signed)
Pt having intermittent bilateral ear pain for over one week.  No known fever or drainage.  Pt states she feels "stopped up".

## 2013-10-26 NOTE — ED Provider Notes (Signed)
CSN: [id number]     Arrival date & time 10/26/13  1031 History   First MD Initiated Contact with Patient 10/26/13 1126     Chief Complaint  Patient presents with  . Otalgia     (Consider location/radiation/quality/duration/timing/severity/associated sxs/prior Treatment) HPI Comments: Pt states that he has been having intermittent bilateral ear pain over the last week. Denies fever or congestion. Hasn't tried anything for the symptoms. No history of similar symptoms  The history is provided by the patient. No language interpreter was used.    Past Medical History  Diagnosis Date  . No pertinent past medical history   . Medical history non-contributory   . History of stillbirth 2013   Past Surgical History  Procedure Laterality Date  . Wisdom tooth extraction     Family History  Problem Relation Age of Onset  . Other Neg Hx   . Cancer Maternal Grandfather    History  Substance Use Topics  . Smoking status: Never Smoker   . Smokeless tobacco: Never Used  . Alcohol Use: No   OB History   Grav Para Term Preterm Abortions TAB SAB Ect Mult Living       Review of Systems  Constitutional: Negative.   Respiratory: Negative.   Cardiovascular: Negative.       Allergies  Review of patient's allergies indicates no known allergies.  Home Medications   Prior to Admission medications   Not on File   BP 105/54  Pulse 64  Temp(Src) 98.9 F (37.2 C) (Oral)  Resp 16  Ht  (1.651 m)  Wt 155 lb (70.308 kg)  BMI 25.79 kg/m2  SpO2 98%  LMP 10/19/2013 Physical Exam  Nursing note and vitals reviewed. Constitutional: She is oriented to person, place, and time. She appears well-developed and well-nourished.  HENT:  Mouth/Throat: Oropharynx is clear and moist.  Bilateral cerumen impaction  Cardiovascular: Normal rate and regular rhythm.   Pulmonary/Chest: Effort normal and breath sounds normal.  Musculoskeletal: Normal range of motion.   Neurological: She is alert and oriented to person, place, and time.    ED Course  Procedures (including critical care time) Labs Review Labs Reviewed - No data to display  Imaging Review No results found.   EKG Interpretation None      MDM   Final diagnoses:  Cerumen impaction, unspecified laterality    No infection noted after ears cleaned by nursing staff. Discussed ear cleaning with pt    Teressa Lower, NP 10/26/13 1251

## 2013-11-05 IMAGING — US US OB COMP +14 WK
2 series · 12 of 28 positions shown · non-contrast
Comparison: none

[Series 1: us ob comp +14 wk · 3 of 12 slices shown (1 of 2)]
[im 3/12]
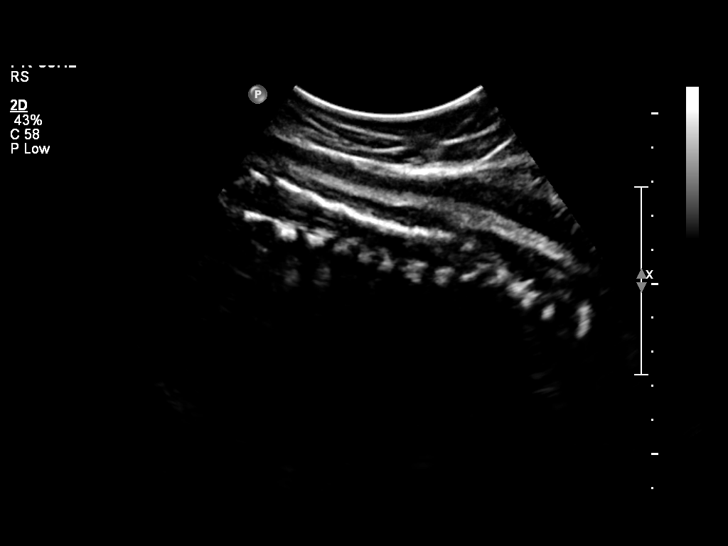
[im 7/12]
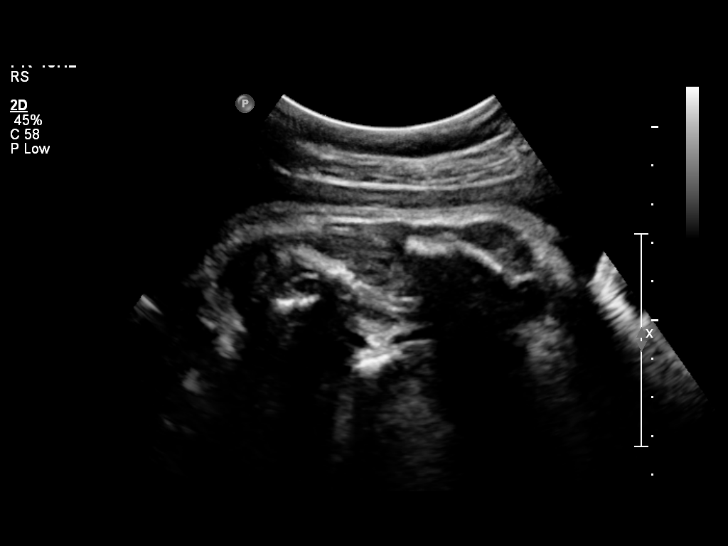
[im 12/12]
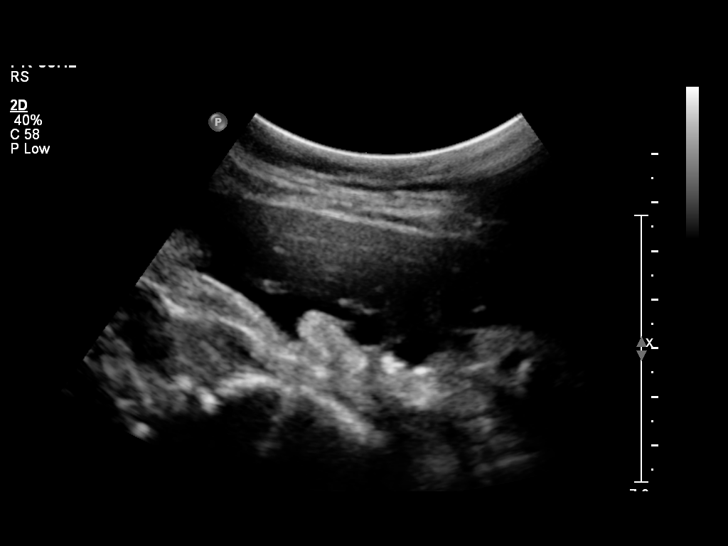

[Series 1: us ob comp +14 wk · 49 acquisitions, 9 frames shown (2 of 2)]
[im 5/49]
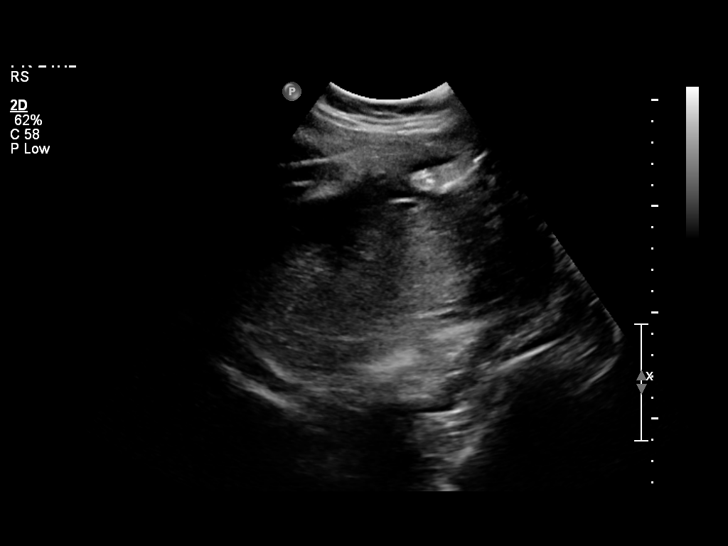
[im 10/49]
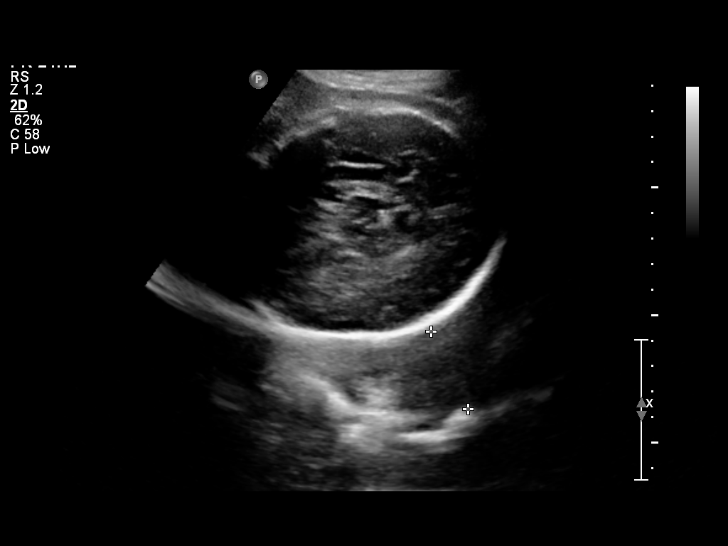
[im 14/49]
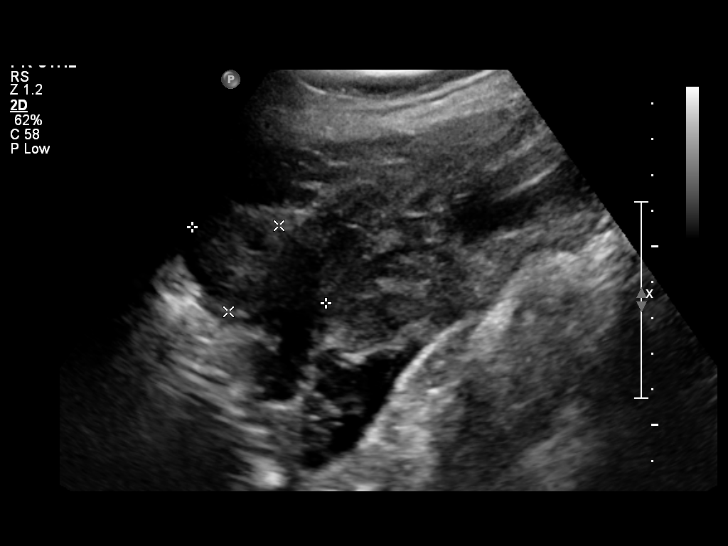
[im 21/49]
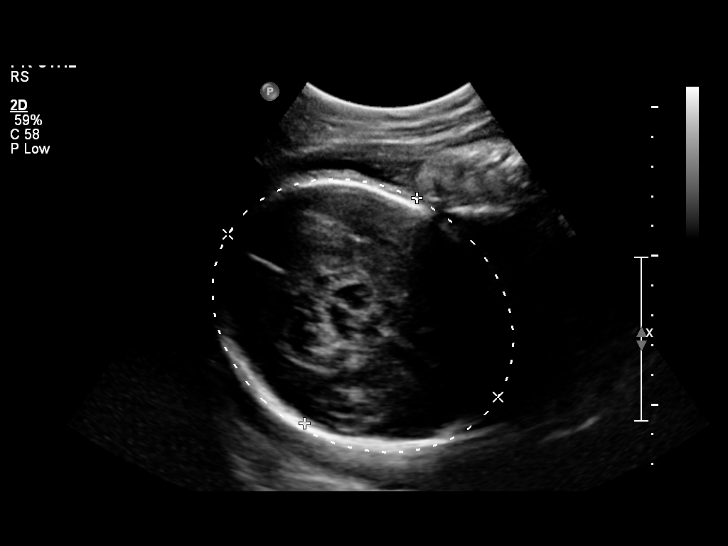
[im 26/49]
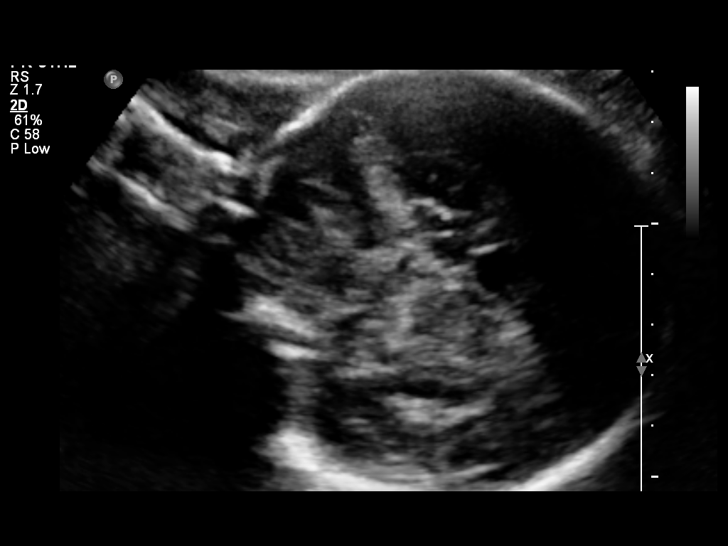
[im 30/49]
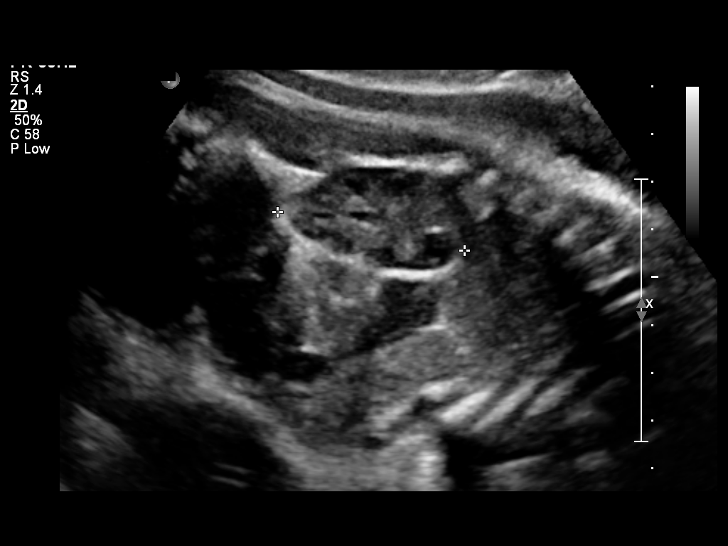
[im 37/49]
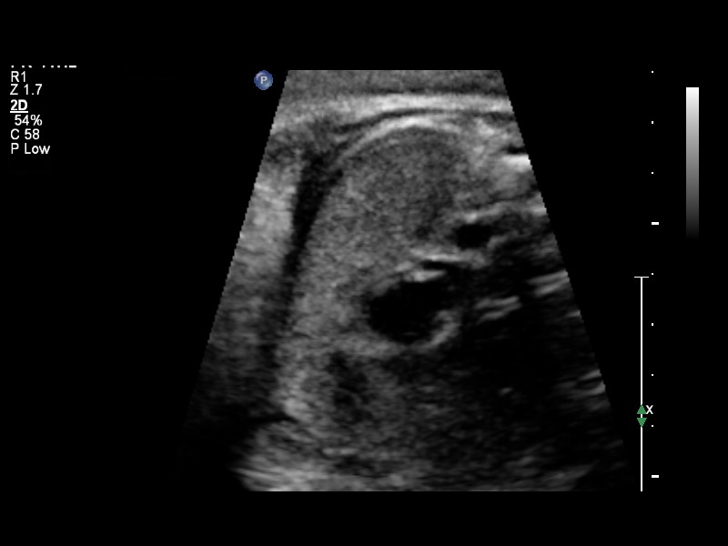
[im 42/49]
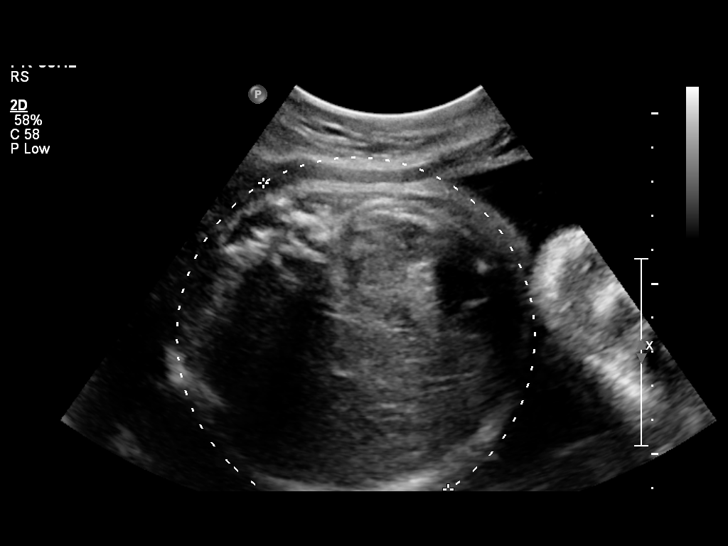
[im 46/49]
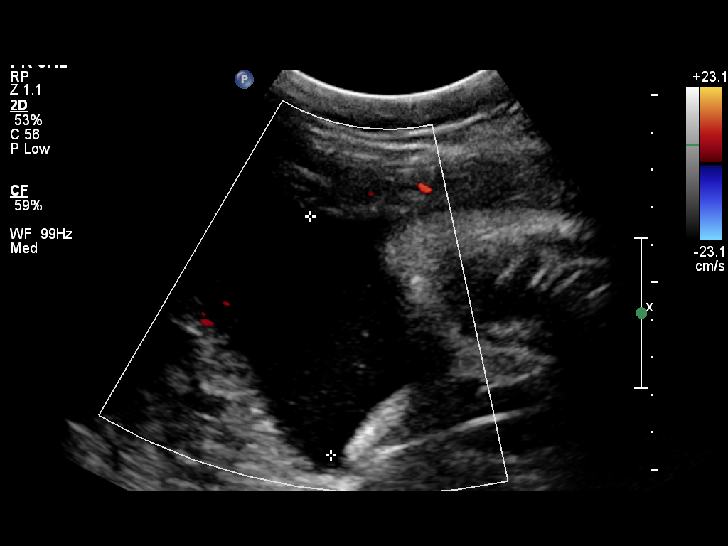

[12 of 28 positions shown; findings below may reference images not displayed]

OBSTETRICS REPORT
                      (Signed Final 08/07/2011 [DATE])

 Order#:         [id number]
Procedures

 US OB COMP + 14 WK                                    76805.1
Indications

 Size less than dates (Small for gestational [AGE]
 FGR)
Fetal Evaluation

 Fetal Heart Rate:  143                         bpm
 Cardiac Activity:  Observed
 Presentation:      Cephalic
 Placenta:          Posterior, above cervical
                    os
 P. Cord            Visualized
 Insertion:

 Amniotic Fluid
 AFI FV:      Subjectively within normal limits
 AFI Sum:     16.73   cm      61   %Tile     Larg Pckt:   6.37   cm
 RUQ:   6.37   cm    RLQ:    2.25   cm    LUQ:   4.27    cm   LLQ:    3.84   cm
Biometry

 BPD:     83.6  mm    G. Age:   33w 4d                CI:        76.47   70 - 86
                                                      FL/HC:      21.7   20.1 -

 HC:     302.9  mm    G. Age:   33w 5d        4  %    HC/AC:      0.94   0.93 -

 AC:     320.8  mm    G. Age:   36w 0d       84  %    FL/BPD:     78.7   71 - 87
 FL:      65.8  mm    G. Age:   33w 6d       20  %    FL/AC:      20.5   20 - 24

 Est. FW:    7888  gm    5 lb 10 oz      63  %
Gestational Age

 Clinical EDD:  34w 6d                                        EDD:   09/12/11
 U/S Today:     34w 2d                                        EDD:   09/16/11
 Best:          34w 6d    Det. By:   Clinical EDD             EDD:   09/12/11
Anatomy

 Cranium:           Appears normal      Aortic Arch:       Basic anatomy
                                                           exam per order
 Fetal Cavum:       Appears normal      Ductal Arch:       Basic anatomy
                                                           exam per order
 Ventricles:        Not well            Diaphragm:         Appears normal
                    visualized
 Choroid Plexus:    Appears normal      Stomach:           Appears
                                                           normal, left
                                                           sided
 Cerebellum:        Appears normal      Abdomen:           Appears normal
 Posterior Fossa:   Not well            Abdominal Wall:    Not well
                    visualized                             visualized
 Nuchal Fold:       Not applicable      Cord Vessels:      Appears normal
                    (>20 wks GA)                           (3 vessel cord)
 Face:              Not well            Kidneys:           Appear normal
                    visualized
 Heart:             Appears normal      Bladder:           Appears normal
                    (4 chamber &
                    axis)
 RVOT:              Appears normal      Spine:             Appears normal
 LVOT:              Not well            Limbs:             Not well
                    visualized                             visualized

 Other:     Technically difficult due to advanced GA and fetal
            position.
Cervix Uterus Adnexa

 Cervical Length:   3.35      cm

 Cervix:       Not visualized (advanced GA >34 wks)
 Left Ovary:   Within normal limits.
 Right Ovary:  Within normal limits.

 Adnexa:     No abnormality visualized.
Impression

 Assigned GA is currently 34w 6d by clinical EDD.
 Appropriate fetal growth, with EFW at 63 %ile.
 Suboptimal visualization of anatomy due to advanced GA,
 however no fetal anomalies identified.
 Amniotic fluid within normal limits, with AFI of 16.73 cm.
 Normal cervical length.

 questions or concerns.

## 2013-11-30 ENCOUNTER — Encounter (HOSPITAL_BASED_OUTPATIENT_CLINIC_OR_DEPARTMENT_OTHER): Payer: Self-pay | Admitting: Emergency Medicine

## 2014-03-05 IMAGING — US US OB COMP LESS 14 WK
1 series · 14 of 26 positions shown · non-contrast
Comparison: None.

CLINICAL DATA: 21-year-old with a prior history of intrauterine
fetal demise at 35 weeks in July 2011.  LMP 09/21/2011 ([DATE] weeks 5
days).  The patient presents now with a vaginal discharge.

OBSTETRIC <14 WK ULTRASOUND
TECHNIQUE: Transabdominal ultrasound was performed for evaluation
of the gestation as well as the maternal uterus and adnexal
regions.

[Series 1: us ob comp less 14 wks · 26 acquisitions, 14 frames shown]
[im 1/26]
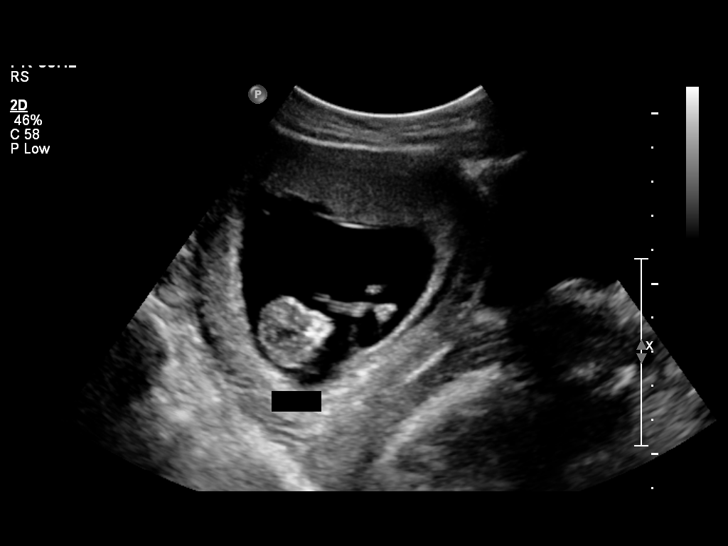
[im 3/26]
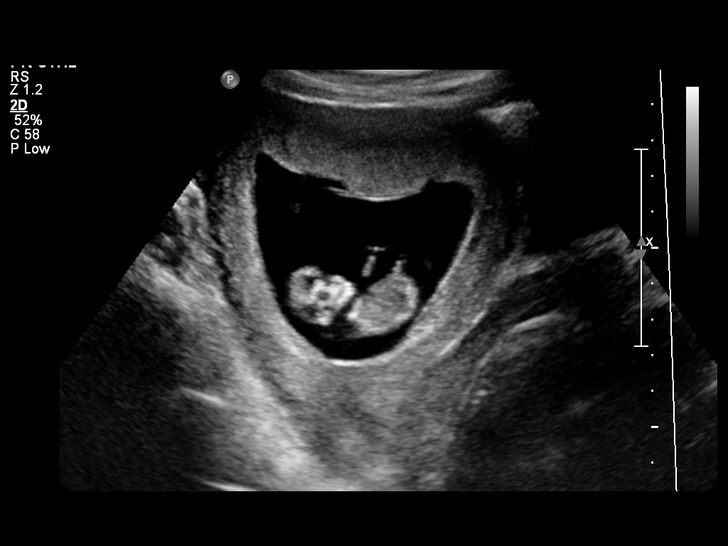
[im 5/26]
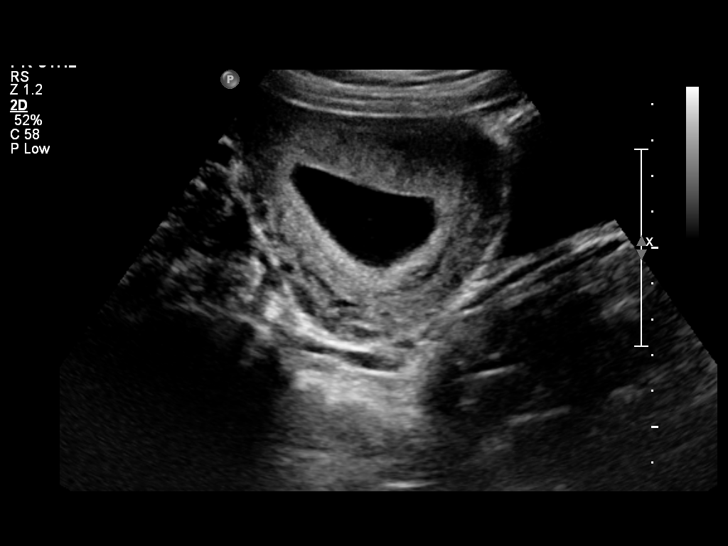
[im 7/26]
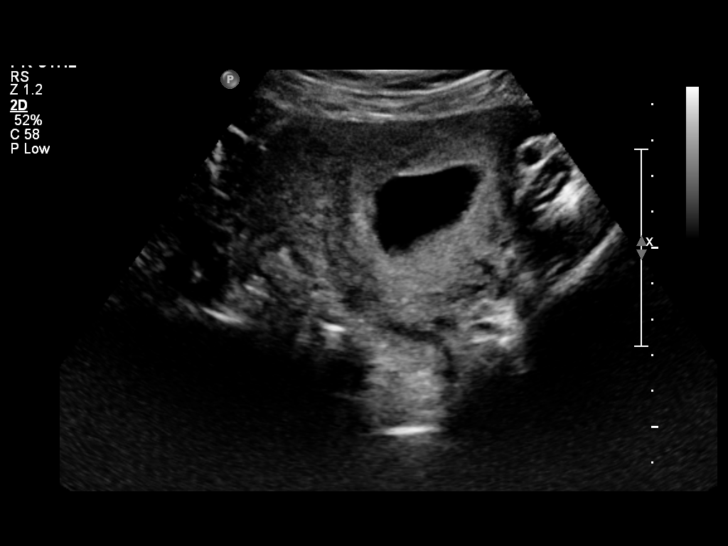
[im 9/26]
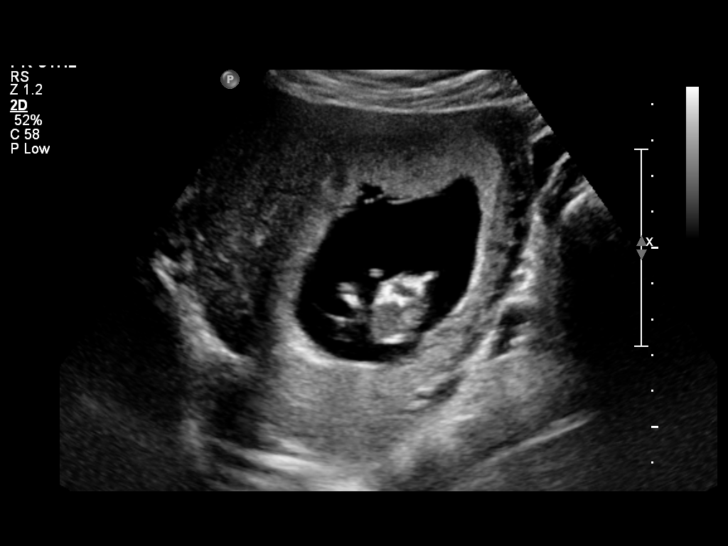
[im 11/26]
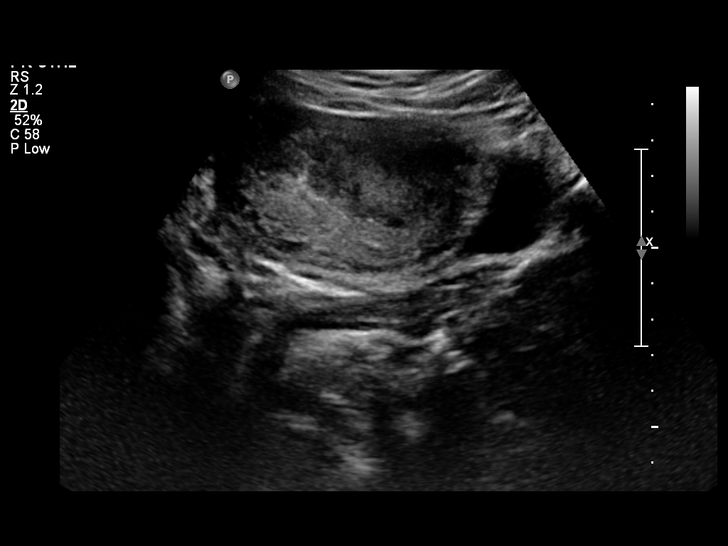
[im 13/26]
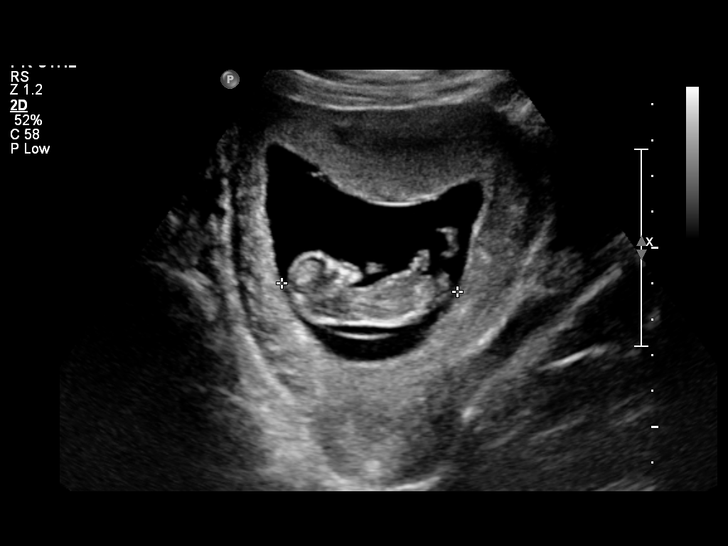
[im 14/26]
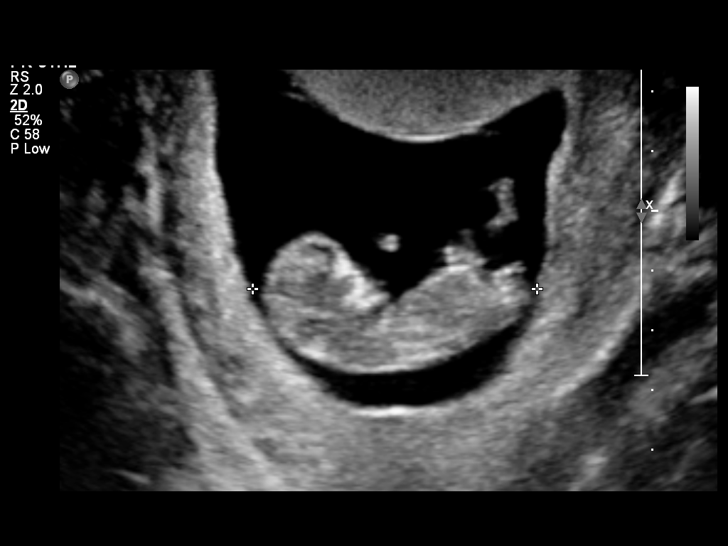
[im 16/26]
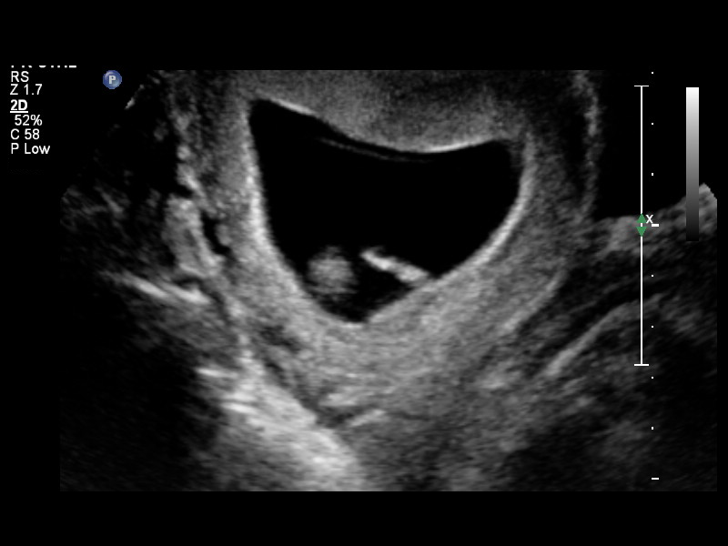
[im 18/26]
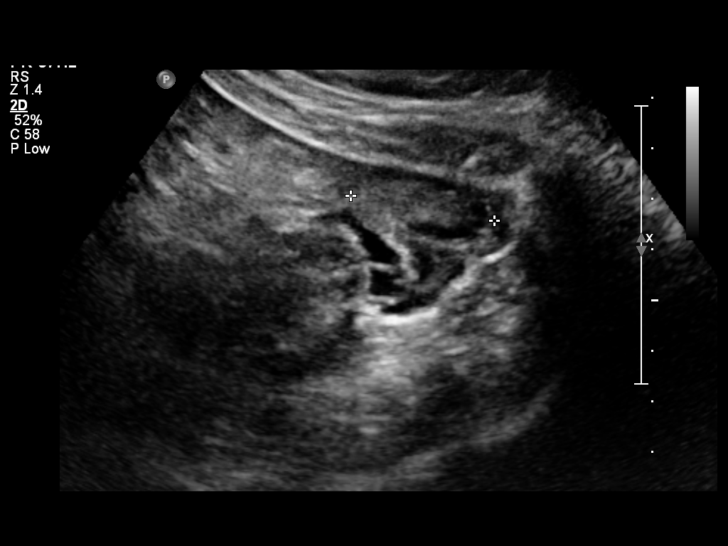
[im 20/26]
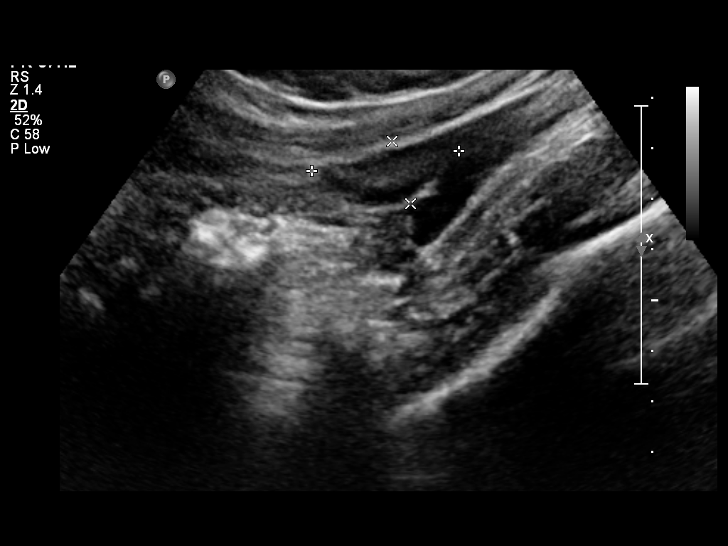
[im 22/26]
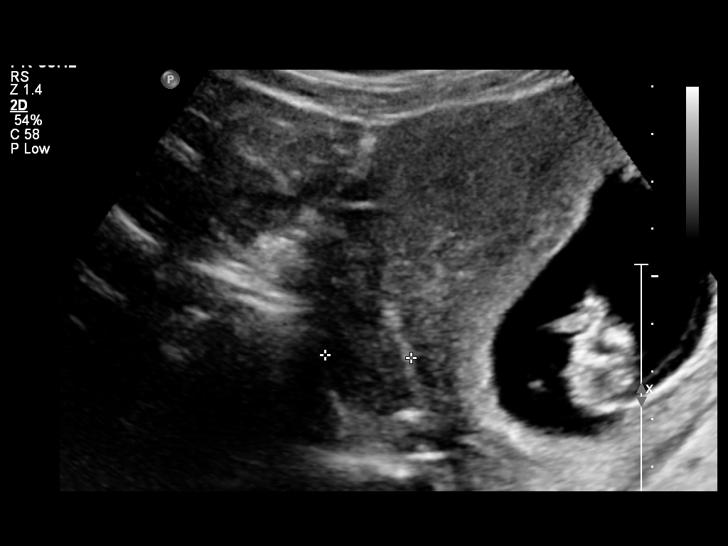
[im 24/26]
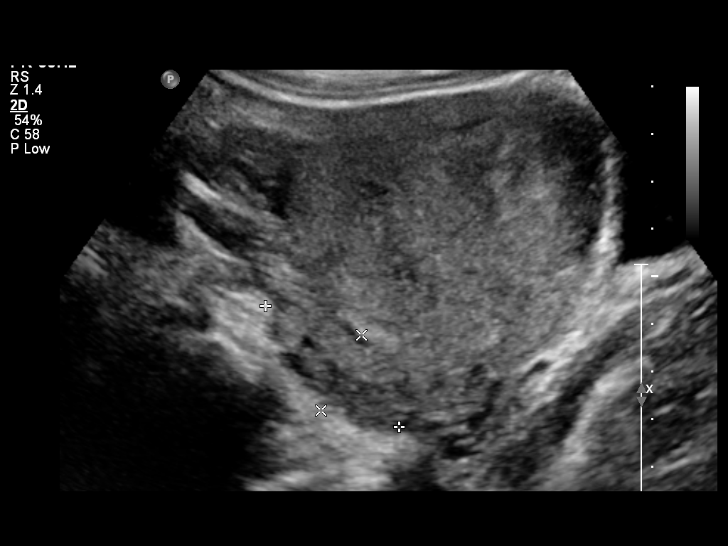
[im 26/26]
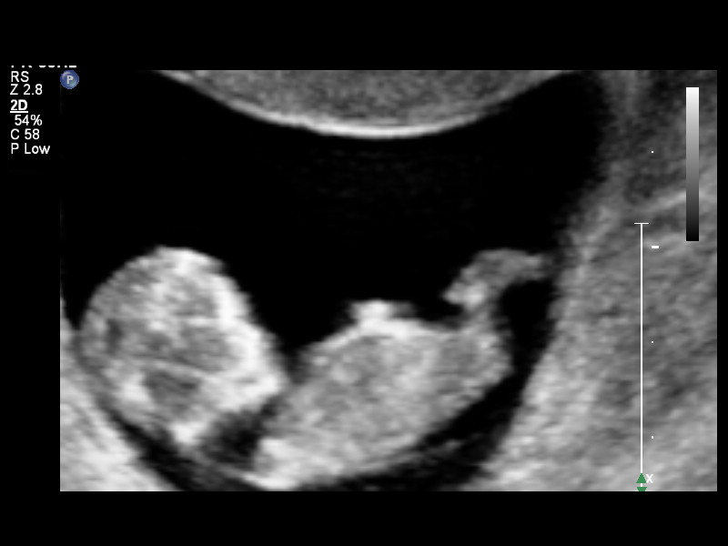

[14 of 26 positions shown; findings below may reference images not displayed]

Intrauterine gestational sac: Single, normal in shape.
Yolk sac: No longer visualized.
Embryo: Visualized.
Cardiac Activity: Visualized.
Heart Rate: 139 bpm

CRL:  47.7 mm  11 w  4 d            US EDC: 06/21/2012.

Maternal uterus/Adnexae:
No evidence of subchorionic hemorrhage.  Normal appearing ovaries
bilaterally, the left measuring approximately 2.9 x 1.3 x 2.9 cm
and the right measuring approximately 3.8 x 1.8 x 1.8 cm.  No
adnexal masses or free pelvic fluid.
IMPRESSION: 1.  Single live intrauterine embryo/fetus with estimated
gestational age 11 weeks 4 days by crown-rump length, EDC
06/21/2012.
2.  No evidence of subchorionic hemorrhage.
3.  Normal-appearing ovaries.  No adnexal masses or free pelvic
fluid.

## 2014-03-28 ENCOUNTER — Encounter (HOSPITAL_BASED_OUTPATIENT_CLINIC_OR_DEPARTMENT_OTHER): Payer: Self-pay | Admitting: *Deleted

## 2014-03-28 ENCOUNTER — Emergency Department (HOSPITAL_BASED_OUTPATIENT_CLINIC_OR_DEPARTMENT_OTHER)
Admission: EM | Admit: 2014-03-28 | Discharge: 2014-03-28 | Disposition: A | Discharge status: Home / Self Care | Payer: Medicaid Other | Attending: Emergency Medicine | Admitting: Emergency Medicine

## 2014-03-28 ENCOUNTER — Emergency Department (HOSPITAL_BASED_OUTPATIENT_CLINIC_OR_DEPARTMENT_OTHER): Payer: Medicaid Other

## 2014-03-28 DIAGNOSIS — R51 Headache: Secondary | ICD-10-CM | POA: Insufficient documentation

## 2014-03-28 DIAGNOSIS — R42 Dizziness and giddiness: Secondary | ICD-10-CM | POA: Insufficient documentation

## 2014-03-28 DIAGNOSIS — R519 Headache, unspecified: Secondary | ICD-10-CM

## 2014-03-28 DIAGNOSIS — Z8679 Personal history of other diseases of the circulatory system: Secondary | ICD-10-CM | POA: Diagnosis not present

## 2014-03-28 DIAGNOSIS — Z3202 Encounter for pregnancy test, result negative: Secondary | ICD-10-CM | POA: Insufficient documentation

## 2014-03-28 DIAGNOSIS — R110 Nausea: Secondary | ICD-10-CM | POA: Insufficient documentation

## 2014-03-28 LAB — PREGNANCY, URINE: Preg Test, Ur: NEGATIVE

## 2014-03-28 MED ORDER — PROMETHAZINE HCL 25 MG/ML IJ SOLN
12.5000 mg | Freq: Once | INTRAMUSCULAR | Status: AC
  Administered 2014-03-28: 12.5 mg via INTRAVENOUS
  Filled 2014-03-28: qty 1

## 2014-03-28 MED ORDER — KETOROLAC TROMETHAMINE 30 MG/ML IJ SOLN
30.0000 mg | Freq: Once | INTRAMUSCULAR | Status: AC
  Administered 2014-03-28: 30 mg via INTRAVENOUS
  Filled 2014-03-28: qty 1

## 2014-03-28 MED ORDER — SODIUM CHLORIDE 0.9 % IV BOLUS (SEPSIS)
500.0000 mL | Freq: Once | INTRAVENOUS | Status: AC
  Administered 2014-03-28: 500 mL via INTRAVENOUS

## 2014-03-28 NOTE — ED Notes (Signed)
Pt to desk- wait explained- caox4- ambulates with steady gait

## 2014-03-28 NOTE — ED Notes (Addendum)
Pt reports head pressure since Thursday (unsure 'what sinus pressure feels like').  A/O x 4.  Reports hx of migraines-states that this is not the same.  Denies a HA at this time.

## 2014-03-28 NOTE — Discharge Instructions (Signed)

## 2014-03-28 NOTE — ED Provider Notes (Signed)
CSN: [id number]     Arrival date & time 03/28/14  1654 History  This chart was scribed for American Expressathan R. Rubin PayorPickering, MD by Tonye RoyaltyJoshua Chen, ED Scribe. This patient was seen in room MH07/MH07 and the patient's care was started at 7:02 PM.    Chief Complaint  Patient presents with  . Headache   The history is provided by the patient. No language interpreter was used.    HPI Comments: Kayla Moon is a 24 y.o. female who presents to the Emergency Department complaining of posterior head "pressure" with onset 3 days ago. She states she has history of recurrent throbbing headaches multiple times a week that typically resolve with Ibuprofen or Excedrin; she states she sometimes has similar head "pressure" which usually resolves when headache resolves. She notes that she at times has the pressure with or without headache and sometimes has headache with or without pressure. She reports associated dizziness yesterday and states she has had nausea. She suspects nausea might be due to birth control medication and states she has nausea sometimes. She denies possibility of pregnancy and states she started her cycle today and it is on time. She states she has had recent sex without protection besides birth control. She states headaches do not correlate with cycle. She states she is normally healthy. She denies visual disturbance, fever, chills, rhinorrhea, or cough.  Past Medical History  Diagnosis Date  . No pertinent past medical history   . Medical history non-contributory   . History of stillbirth 2013   Past Surgical History  Procedure Laterality Date  . Wisdom tooth extraction     Family History  Problem Relation Age of Onset  . Other Neg Hx   . Cancer Maternal Grandfather    History  Substance Use Topics  . Smoking status: Never Smoker   . Smokeless tobacco: Never Used  . Alcohol Use: No   OB History    Gravida Para Term Preterm AB TAB SAB Ectopic Multiple Living   2 2 0 2 0 0 0 0 0 1       Review of Systems  Constitutional: Negative for fever and chills.  HENT: Negative for rhinorrhea.   Respiratory: Negative for cough.   Gastrointestinal: Positive for nausea.  Neurological: Positive for dizziness and headaches.  All other systems reviewed and are negative.     Allergies  Review of patient's allergies indicates no known allergies.  Home Medications   Prior to Admission medications   Not on File   BP 130/66 mmHg  Pulse 76  Temp(Src) 98.6 F (37 C) (Oral)  Resp 16  Ht 5\' 5"  (1.651 m)  Wt 155 lb (70.308 kg)  BMI 25.79 kg/m2  SpO2 99%  LMP 03/28/2014 Physical Exam  Constitutional: She is oriented to person, place, and time. She appears well-developed and well-nourished.  HENT:  Head: Normocephalic and atraumatic.  Eyes: Conjunctivae are normal.  Neck: Normal range of motion. Neck supple.  Cardiovascular: Normal rate, regular rhythm and normal heart sounds.   No murmur heard. Pulmonary/Chest: Effort normal and breath sounds normal. No respiratory distress. She has no wheezes. She has no rales.  Abdominal: Soft. She exhibits no distension. There is no tenderness.  Musculoskeletal: Normal range of motion.  Neurological: She is alert and oriented to person, place, and time. No cranial nerve deficit. She exhibits normal muscle tone. Coordination normal.  Skin: Skin is warm and dry.  Psychiatric: She has a normal mood and affect.  Nursing note and vitals reviewed.  ED Course  Procedures (including critical care time)  DIAGNOSTIC STUDIES: Oxygen Saturation is 99% on room air, normal by my interpretation.    COORDINATION OF CARE: 7:10 PM Discussed treatment plan with patient at beside, including CT of her head given history of frequent headaches. The patient agrees with the plan and has no further questions at this time.   Labs Review Labs Reviewed - No data to display  Imaging Review No results found.   EKG Interpretation None      MDM    Final diagnoses:  None    Patient with head pressure. History of headaches that she states her migraines. This had headache is more pressure is a little different. No relief. Head CT reassuring. Feels better after treatment here. Will discharge home his been given information for neurology for following up for her chronic headaches.  I personally performed the services described in this documentation, which was scribed in my presence. The recorded information has been reviewed and is accurate.    Juliet Rude. Rubin Payor, MD 03/28/14 2119

## 2015-01-09 ENCOUNTER — Encounter (HOSPITAL_BASED_OUTPATIENT_CLINIC_OR_DEPARTMENT_OTHER): Payer: Self-pay | Admitting: *Deleted

## 2015-01-09 ENCOUNTER — Emergency Department (HOSPITAL_BASED_OUTPATIENT_CLINIC_OR_DEPARTMENT_OTHER)
Admission: EM | Admit: 2015-01-09 | Discharge: 2015-01-09 | Disposition: A | Discharge status: Home / Self Care | Payer: Medicaid Other | Attending: Emergency Medicine | Admitting: Emergency Medicine

## 2015-01-09 DIAGNOSIS — Z9889 Other specified postprocedural states: Secondary | ICD-10-CM | POA: Diagnosis not present

## 2015-01-09 DIAGNOSIS — R600 Localized edema: Secondary | ICD-10-CM | POA: Diagnosis present

## 2015-01-09 MED ORDER — ENOXAPARIN SODIUM 80 MG/0.8ML ~~LOC~~ SOLN
1.0000 mg/kg | Freq: Once | SUBCUTANEOUS | Status: AC
Start: 2015-01-09 — End: 2015-01-09
  Administered 2015-01-09: 75 mg via SUBCUTANEOUS
  Filled 2015-01-09: qty 0.8

## 2015-01-09 NOTE — ED Notes (Signed)
Pt had liposuction and "fat-transfer" surgery done on Monday at an out of state hospital.  States that she flew back in tonight and noticed more swelling than she expected.  Denies pain

## 2015-01-09 NOTE — ED Provider Notes (Signed)
CSN: [id number]     Arrival date & time 01/09/15  2245 History  By signing my name below, I, [name], attest that this documentation has been prepared under the direction and in the presence of Paula Libra, MD. Electronically Signed: [name], ED Scribe. 01/09/2015. 11:19 PM.    Chief Complaint  Patient presents with  . Post-op Problem     The history is provided by the patient. No language interpreter was used.   HPI Comments: Kayla Moon is a 24 y.o. female who presents to the Emergency Department complaining of a post-op problem. Pt states that she had liposuction surgery six days ago in the [place] where fat was suctioned from her abdomen and grafted into her buttocks. Pt states she was told to expect lower extremity swelling from the procedure, but she developed significant edema after flying home from the [place] today. Pt states she was given a blood thinner in the form of a shot about 11 AM this morning before she left, but does not recall what the medication was. Pt denies leg pain, fever, SOB, chest pain.   Past Medical History  Diagnosis Date  . No pertinent past medical history   . Medical history non-contributory   . History of stillbirth 2013   Past Surgical History  Procedure Laterality Date  . Wisdom tooth extraction     Family History  Problem Relation Age of Onset  . Other Neg Hx   . Cancer Maternal Grandfather    Social History  Substance Use Topics  . Smoking status: Never Smoker   . Smokeless tobacco: Never Used  . Alcohol Use: No   OB History    Gravida Para Term Preterm AB TAB SAB Ectopic Multiple Living       Review of Systems  A complete 10 system review of systems was obtained and all systems are negative except as noted in the HPI and PMH.    Allergies  Review of patient's allergies indicates no known allergies.  Home Medications   Prior to Admission medications   Not on File   BP  126/91 mmHg  Pulse 79  Temp(Src) 98.3 F (36.8 C) (Oral)  Resp 18  Ht  (1.651 m)  Wt 162 lb 5 oz (73.624 kg)  BMI 27.01 kg/m2  SpO2 100%  LMP 01/09/2015   Physical Exam  General: Well-developed, well-nourished female in no acute distress; appearance consistent with age of record HENT: normocephalic; atraumatic Eyes: pupils equal, round and reactive to light; extraocular muscles intact Neck: supple Heart: regular rate and rhythm Lungs: clear to auscultation bilaterally Abdomen: soft; nondistended; nontender; no masses or hepatosplenomegaly; bowel sounds present Extremities: No deformity; full range of motion; pulses normal; 1-2+ edema of lower extremities  Neurologic: Awake, alert and oriented; motor function intact in all extremities and symmetric; no facial droop Skin: Warm and dry; three tiny well-healing incisions of the naval and bilateral lower abdomen without signs of infection; small incision in her coccygeal area without signs of infection Psychiatric: Normal mood and affect    ED Course  Procedures (including critical care time)  DIAGNOSTIC STUDIES: Oxygen Saturation is 100% on room air, normal by my interpretation.    COORDINATION OF CARE: 11:09 PM Discussed treatment plan with pt at bedside and pt agreed to plan.   MDM  We'll treat with Lovenox and have her return for lower extremity venous Dopplers tomorrow.   Final diagnoses:  Bilateral lower extremity edema   I personally performed the services described in this documentation, which was scribed in my presence. The recorded information has been reviewed and is accurate.    [name], MD 01/09/15 2325

## 2015-01-10 ENCOUNTER — Ambulatory Visit (HOSPITAL_BASED_OUTPATIENT_CLINIC_OR_DEPARTMENT_OTHER): Payer: Medicaid Other

## 2015-01-11 ENCOUNTER — Other Ambulatory Visit (HOSPITAL_BASED_OUTPATIENT_CLINIC_OR_DEPARTMENT_OTHER): Payer: Self-pay | Admitting: Emergency Medicine

## 2015-01-11 ENCOUNTER — Ambulatory Visit (HOSPITAL_BASED_OUTPATIENT_CLINIC_OR_DEPARTMENT_OTHER)
Admission: RE | Admit: 2015-01-11 | Discharge: 2015-01-11 | Disposition: A | Discharge status: Home / Self Care | Payer: Medicaid Other | Source: Ambulatory Visit | Attending: Emergency Medicine | Admitting: Emergency Medicine

## 2015-01-11 DIAGNOSIS — M7989 Other specified soft tissue disorders: Secondary | ICD-10-CM | POA: Diagnosis present

## 2015-01-11 DIAGNOSIS — R600 Localized edema: Secondary | ICD-10-CM | POA: Insufficient documentation

## 2015-07-28 ENCOUNTER — Encounter (HOSPITAL_BASED_OUTPATIENT_CLINIC_OR_DEPARTMENT_OTHER): Payer: Self-pay | Admitting: *Deleted

## 2015-07-28 ENCOUNTER — Emergency Department (HOSPITAL_BASED_OUTPATIENT_CLINIC_OR_DEPARTMENT_OTHER)
Admission: EM | Admit: 2015-07-28 | Discharge: 2015-07-28 | Disposition: A | Discharge status: Home / Self Care | Payer: Medicaid Other | Attending: Emergency Medicine | Admitting: Emergency Medicine

## 2015-07-28 ENCOUNTER — Emergency Department (HOSPITAL_BASED_OUTPATIENT_CLINIC_OR_DEPARTMENT_OTHER): Payer: Medicaid Other

## 2015-07-28 DIAGNOSIS — N83202 Unspecified ovarian cyst, left side: Secondary | ICD-10-CM

## 2015-07-28 DIAGNOSIS — N760 Acute vaginitis: Secondary | ICD-10-CM | POA: Diagnosis not present

## 2015-07-28 DIAGNOSIS — B9689 Other specified bacterial agents as the cause of diseases classified elsewhere: Secondary | ICD-10-CM

## 2015-07-28 DIAGNOSIS — R1031 Right lower quadrant pain: Secondary | ICD-10-CM | POA: Diagnosis present

## 2015-07-28 LAB — CBC WITH DIFFERENTIAL/PLATELET
BASOS ABS: 0 10*3/uL (ref 0.0–0.1)
BASOS PCT: 0 %
EOS ABS: 0.1 10*3/uL (ref 0.0–0.7)
EOS PCT: 1 %
HCT: 35.6 % — ABNORMAL LOW (ref 36.0–46.0)
Hemoglobin: 11.9 g/dL — ABNORMAL LOW (ref 12.0–15.0)
Lymphocytes Relative: 28 %
Lymphs Abs: 2.5 10*3/uL (ref 0.7–4.0)
MCH: 27.4 pg (ref 26.0–34.0)
MCHC: 33.4 g/dL (ref 30.0–36.0)
MCV: 81.8 fL (ref 78.0–100.0)
MONO ABS: 0.7 10*3/uL (ref 0.1–1.0)
Monocytes Relative: 7 %
Neutro Abs: 5.9 10*3/uL (ref 1.7–7.7)
Neutrophils Relative %: 64 %
PLATELETS: 170 10*3/uL (ref 150–400)
RBC: 4.35 MIL/uL (ref 3.87–5.11)
RDW: 12 % (ref 11.5–15.5)
WBC: 9.2 10*3/uL (ref 4.0–10.5)

## 2015-07-28 LAB — COMPREHENSIVE METABOLIC PANEL
ALBUMIN: 3.7 g/dL (ref 3.5–5.0)
ALK PHOS: 41 U/L (ref 38–126)
ALT: 11 U/L — ABNORMAL LOW (ref 14–54)
AST: 20 U/L (ref 15–41)
Anion gap: 9 (ref 5–15)
BILIRUBIN TOTAL: 0.3 mg/dL (ref 0.3–1.2)
BUN: 12 mg/dL (ref 6–20)
CALCIUM: 8.7 mg/dL — AB (ref 8.9–10.3)
CO2: 25 mmol/L (ref 22–32)
CREATININE: 0.58 mg/dL (ref 0.44–1.00)
Chloride: 104 mmol/L (ref 101–111)
GFR calc Af Amer: >60 mL/min (ref >60)
GFR calc non Af Amer: >60 mL/min (ref >60)
GLUCOSE: 79 mg/dL (ref 65–99)
Potassium: 3.6 mmol/L (ref 3.5–5.1)
Sodium: 138 mmol/L (ref 135–145)
TOTAL PROTEIN: 6.8 g/dL (ref 6.5–8.1)

## 2015-07-28 LAB — URINALYSIS, ROUTINE W REFLEX MICROSCOPIC
Bilirubin Urine: NEGATIVE
GLUCOSE, UA: NEGATIVE mg/dL
HGB URINE DIPSTICK: NEGATIVE
Ketones, ur: NEGATIVE mg/dL
LEUKOCYTES UA: NEGATIVE
Nitrite: NEGATIVE
PH: 6 (ref 5.0–8.0)
Protein, ur: NEGATIVE mg/dL
Specific Gravity, Urine: 1.021 (ref 1.005–1.030)

## 2015-07-28 LAB — WET PREP, GENITAL
CLUE CELLS WET PREP: NONE SEEN
SPERM: NONE SEEN
TRICH WET PREP: NONE SEEN
Yeast Wet Prep HPF POC: NONE SEEN

## 2015-07-28 LAB — PREGNANCY, URINE: Preg Test, Ur: NEGATIVE

## 2015-07-28 MED ORDER — HYDROCODONE-ACETAMINOPHEN 5-325 MG PO TABS: 1.0000 | ORAL_TABLET | ORAL | Status: DC | PRN

## 2015-07-28 MED ORDER — SODIUM CHLORIDE 0.9 % IV BOLUS (SEPSIS)
1000.0000 mL | Freq: Once | INTRAVENOUS | Status: AC
  Administered 2015-07-28: 1000 mL via INTRAVENOUS

## 2015-07-28 MED ORDER — IOPAMIDOL (ISOVUE-300) INJECTION 61%
100.0000 mL | Freq: Once | INTRAVENOUS | Status: AC | PRN
  Administered 2015-07-28: 100 mL via INTRAVENOUS

## 2015-07-28 MED ORDER — METRONIDAZOLE 500 MG PO TABS: 500.0000 mg | ORAL_TABLET | Freq: Two times a day (BID) | ORAL | Status: DC

## 2015-07-28 MED ORDER — METRONIDAZOLE 500 MG PO TABS
500.0000 mg | ORAL_TABLET | Freq: Once | ORAL | Status: AC
  Administered 2015-07-28: 500 mg via ORAL
  Filled 2015-07-28: qty 1

## 2015-07-28 NOTE — Discharge Instructions (Signed)

## 2015-07-28 NOTE — ED Notes (Signed)
Last period was 4th of June and shorter than usual per Pt.   Pt. Does use BC pills.

## 2015-07-28 NOTE — ED Notes (Signed)
Lower abdominal pain since last night. Pressure. No vaginal discharge. She was seen by UC earlier this am and her urine was negative for UTI.

## 2015-07-28 NOTE — ED Provider Notes (Signed)
CSN: [id number]     Arrival date & time 07/28/15  1405 History   First MD Initiated Contact with Patient 07/28/15 1549     Chief Complaint  Patient presents with  . Abdominal Pain  PT IS A 25 YO BF WHO HAS HAD LOWER ABDOMINAL PAIN SINCE LAST NIGHT.  THE PT DENIES ANY FEVERS OR CHILLS OR N/V.  PT WENT TO URGENT CARE AND THEY CHECKED A URINE WHICH WAS NEGATIVE.  PT SAID THEY TOLD HER TO COME HERE IF SHE WAS NOT ANY BETTER.   (Consider location/radiation/quality/duration/timing/severity/associated sxs/prior Treatment) Patient is a 25 y.o. female presenting with abdominal pain. The history is provided by the patient.  Abdominal Pain Pain location:  Suprapubic and RLQ Pain quality: aching   Pain radiates to:  Does not radiate Pain severity:  Moderate Onset quality:  Sudden Timing:  Constant Progression:  Unchanged   Past Medical History  Diagnosis Date  . No pertinent past medical history   . Medical history non-contributory   . History of stillbirth 2013   Past Surgical History  Procedure Laterality Date  . Wisdom tooth extraction     Family History  Problem Relation Age of Onset  . Other Neg Hx   . Cancer Maternal Grandfather    Social History  Substance Use Topics  . Smoking status: Never Smoker   . Smokeless tobacco: Never Used  . Alcohol Use: No   OB History    Gravida Para Term Preterm AB TAB SAB Ectopic Multiple Living   2 2 0 2 0 0 0 0 0 1      Review of Systems  Gastrointestinal: Positive for abdominal pain.  All other systems reviewed and are negative.     Allergies  Review of patient's allergies indicates no known allergies.  Home Medications   Prior to Admission medications   Medication Sig Start Date End Date Taking? Authorizing Provider  HYDROcodone-acetaminophen (NORCO/VICODIN) 5-325 MG tablet Take 1 tablet by mouth every 4 (four) hours as needed. 07/28/15   Jacalyn LefevreJulie Javaughn Opdahl, MD  metroNIDAZOLE (FLAGYL) 500 MG tablet Take 1 tablet (500 mg total) by  mouth 2 (two) times daily. 07/28/15   Jacalyn LefevreJulie Arra Connaughton, MD   BP 112/74 mmHg  Pulse 90  Temp(Src) 98.8 F (37.1 C) (Oral)  Resp 18  Ht 5\' 5"  (1.651 m)  Wt 154 lb (69.854 kg)  BMI 25.63 kg/m2  SpO2 100%  LMP 07/03/2015 Physical Exam  Constitutional: She is oriented to person, place, and time. She appears well-developed and well-nourished.  HENT:  Head: Normocephalic and atraumatic.  Right Ear: External ear normal.  Left Ear: External ear normal.  Nose: Nose normal.  Mouth/Throat: Oropharynx is clear and moist.  Eyes: Conjunctivae and EOM are normal. Pupils are equal, round, and reactive to light.  Neck: Normal range of motion. Neck supple.  Cardiovascular: Normal rate, regular rhythm, normal heart sounds and intact distal pulses.   Pulmonary/Chest: Effort normal and breath sounds normal.  Abdominal: Soft. Bowel sounds are normal. There is tenderness in the right lower quadrant and suprapubic area.  Genitourinary: Vagina normal. Right adnexum displays no tenderness. Left adnexum displays no tenderness. No tenderness in the vagina.  Musculoskeletal: Normal range of motion.  Neurological: She is alert and oriented to person, place, and time.  Skin: Skin is warm and dry.  Psychiatric: She has a normal mood and affect. Her behavior is normal. Judgment and thought content normal.  Nursing note and vitals reviewed.   ED Course  Procedures (including  critical care time) Labs Review Labs Reviewed  WET PREP, GENITAL - Abnormal; Notable for the following:    WBC, Wet Prep HPF POC MODERATE (*)    All other components within normal limits  COMPREHENSIVE METABOLIC PANEL - Abnormal; Notable for the following:    Calcium 8.7 (*)    ALT 11 (*)    All other components within normal limits  CBC WITH DIFFERENTIAL/PLATELET - Abnormal; Notable for the following:    Hemoglobin 11.9 (*)    HCT 35.6 (*)    All other components within normal limits  URINALYSIS, ROUTINE W REFLEX MICROSCOPIC (NOT AT  [facility])  PREGNANCY, URINE  GC/CHLAMYDIA PROBE AMP ([place]) NOT AT [facility]    Imaging Review Ct Abdomen Pelvis W Contrast  07/28/2015  CLINICAL DATA:  Right lower quadrant pain.  Lower abdominal pain. EXAM: CT ABDOMEN AND PELVIS WITH CONTRAST TECHNIQUE: Multidetector CT imaging of the abdomen and pelvis was performed using the standard protocol following bolus administration of intravenous contrast. CONTRAST:  100mL ISOVUE-300 IOPAMIDOL (ISOVUE-300) INJECTION 61% COMPARISON:  None. FINDINGS: Lower chest:  No acute findings. Hepatobiliary: No masses or other significant abnormality. Pancreas: No mass, inflammatory changes, or other significant abnormality. Spleen: Within normal limits in size and appearance. Adrenals/Urinary Tract: No masses identified. No evidence of hydronephrosis. Stomach/Bowel: No evidence of obstruction, inflammatory process, or abnormal fluid collections. Normal appendix. Small amount of pelvic free fluid. No pneumatosis, pneumoperitoneum or portal venous gas. Vascular/Lymphatic: No pathologically enlarged lymph nodes. No evidence of abdominal aortic aneurysm. Reproductive: Normal uterus. 5.1 x 3.4 cm fluid attenuating left adnexal mass likely reflecting a left ovarian cyst. Other: None. Musculoskeletal:  No suspicious bone lesions identified. IMPRESSION: 1. Normal appendix. 2. Left ovarian cyst measuring 5.1 x 3.4 cm. Electronically Signed   By: Elige KoHetal  Patel   On: 07/28/2015 17:04   I have personally reviewed and evaluated these images and lab results as part of my medical decision-making.   EKG Interpretation None      MDM  PT IS FEELING BETTER.  HER PAIN WAS MORE ON THE RIGHT AND NOT THE LEFT, SO I DID NOT FEEL LIKE I NEED TO EVAL THAT LEFT OVARIAN CYST FURTHER, BUT I DID TELL THE PT ABOUT IT.  SHE IS INSTR TO F/U WITH HER OBGYN PROVIDER, KATHY, AT [facility].  PT KNOWS TO RETURN IF WORSE.  Final diagnoses:  Bacterial vaginosis  Left ovarian cyst      Jacalyn LefevreJulie  Gregoire Bennis, MD 07/28/15 [phone]

## 2015-07-29 LAB — GC/CHLAMYDIA PROBE AMP (~~LOC~~) NOT AT ARMC
CHLAMYDIA, DNA PROBE: NEGATIVE
Neisseria Gonorrhea: NEGATIVE

## 2015-09-26 ENCOUNTER — Encounter (HOSPITAL_BASED_OUTPATIENT_CLINIC_OR_DEPARTMENT_OTHER): Payer: Self-pay | Admitting: Emergency Medicine

## 2015-09-26 ENCOUNTER — Emergency Department (HOSPITAL_BASED_OUTPATIENT_CLINIC_OR_DEPARTMENT_OTHER)
Admission: EM | Admit: 2015-09-26 | Discharge: 2015-09-26 | Disposition: A | Discharge status: Home / Self Care | Payer: Medicaid Other | Attending: Emergency Medicine | Admitting: Emergency Medicine

## 2015-09-26 DIAGNOSIS — L299 Pruritus, unspecified: Secondary | ICD-10-CM | POA: Diagnosis not present

## 2015-09-26 DIAGNOSIS — Z79818 Long term (current) use of other agents affecting estrogen receptors and estrogen levels: Secondary | ICD-10-CM | POA: Insufficient documentation

## 2015-09-26 DIAGNOSIS — R21 Rash and other nonspecific skin eruption: Secondary | ICD-10-CM | POA: Diagnosis present

## 2015-09-26 NOTE — ED Triage Notes (Signed)
Pt started itching on Sunday.  Started at neck and chest then to abdomen and legs.

## 2015-09-26 NOTE — ED Provider Notes (Signed)
[facility] Provider Note   CSN: [id number] Arrival date & time: 09/26/15  0825     History   Chief Complaint Chief Complaint  Patient presents with  . Rash    HPI Kayla Moon is a 25 y.o. female.  The history is provided by the patient.  Rash   This is a new problem. The current episode started 2 days ago. The problem has not changed since onset.There has been no fever. The rash is present on the head, back, neck, trunk, right upper leg, left upper leg, left arm and right arm. The pain is mild. The pain has been constant since onset. Associated symptoms include itching. Pertinent negatives include no blisters. She has tried antihistamines for the symptoms. The treatment provided mild relief.   Ate fish 7 hours prior to onset. Also had hair washed at a new hair salon as well. No breathing problems, throat swelling, perioral numbness, flushing.   Past Medical History:  Diagnosis Date  . History of stillbirth 2013  . Medical history non-contributory   . No pertinent past medical history     Patient Active Problem List   Diagnosis Date Noted  . NSVD (normal spontaneous vaginal delivery) 06/05/2012  . Preterm uterine contractions, antepartum 05/21/2012  . History of stillbirth     Past Surgical History:  Procedure Laterality Date  . WISDOM TOOTH EXTRACTION      OB History    Gravida Para Term Preterm AB Living   2 2 0 2 0 1   SAB TAB Ectopic Multiple Live Births   0 0 0 0 1       Home Medications    Prior to Admission medications   Medication Sig Start Date End Date Taking? Authorizing Provider  levonorgestrel-ethinyl estradiol (ENPRESSE,TRIVORA) tablet Take 1 tablet by mouth daily.   Yes Historical Provider, MD    Family History Family History  Problem Relation Age of Onset  . Cancer Maternal Grandfather   . Other Neg Hx     Social History Social History  Substance Use Topics  . Smoking status: Never Smoker  . Smokeless tobacco:  Never Used  . Alcohol use No     Allergies   Review of patient's allergies indicates no known allergies.   Review of Systems Review of Systems  Constitutional: Negative for appetite change, chills, fatigue and fever.  HENT: Negative for congestion, facial swelling, mouth sores and sore throat.   Eyes: Negative for visual disturbance.  Respiratory: Negative for cough, chest tightness and shortness of breath.   Cardiovascular: Negative for chest pain and palpitations.  Gastrointestinal: Negative for abdominal pain, blood in stool, diarrhea, nausea and vomiting.  Endocrine: Negative for cold intolerance and heat intolerance.  Genitourinary: Negative for decreased urine volume, difficulty urinating and frequency.  Musculoskeletal: Negative for back pain and neck stiffness.  Skin: Positive for itching and rash.  Neurological: Negative for dizziness, weakness, light-headedness and headaches.  All other systems reviewed and are negative.    Physical Exam Updated Vital Signs BP 122/77 (BP Location: Right Arm)   Pulse 88   Temp 98 F (36.7 C) (Oral)   Resp 16   Ht 5\' 5"  (1.651 m)   Wt 156 lb (70.8 kg)   LMP 09/06/2015   SpO2 100%   BMI 25.96 kg/m   Physical Exam  Constitutional: She is oriented to person, place, and time. She appears well-developed and well-nourished. No distress.  HENT:  Head: Normocephalic and atraumatic.  Right Ear: External  ear normal.  Left Ear: External ear normal.  Nose: Nose normal.  Eyes: Conjunctivae and EOM are normal. Pupils are equal, round, and reactive to light. Right eye exhibits no discharge. Left eye exhibits no discharge. No scleral icterus.  Neck: Normal range of motion. Neck supple.  Cardiovascular: Normal rate, regular rhythm and normal heart sounds.  Exam reveals no gallop and no friction rub.   No murmur heard. Pulmonary/Chest: Effort normal and breath sounds normal. No stridor. No respiratory distress. She has no wheezes.    Abdominal: Soft. She exhibits no distension. There is no tenderness.  Musculoskeletal: She exhibits no edema or tenderness.  Neurological: She is alert and oriented to person, place, and time.  Skin: Skin is warm and dry. No rash noted. She is not diaphoretic. No erythema.  Redness in the pattern of fingernails on neck, back, abd, and chest.  Psychiatric: She has a normal mood and affect.     ED Treatments / Results  Labs (all labs ordered are listed, but only abnormal results are displayed) Labs Reviewed - No data to display  EKG  EKG Interpretation None       Radiology No results found.  Procedures Procedures (including critical care time)  Medications Ordered in ED Medications - No data to display   Initial Impression / Assessment and Plan / ED Course  I have reviewed the triage vital signs and the nursing notes.  Pertinent labs & imaging results that were available during my care of the patient were reviewed by me and considered in my medical decision making (see chart for details).  Clinical Course    Pruritus of unknown etiology. No history of liver disease. No right upper quadrant tenderness. Last menstrual period earlier this month and doubts that she is pregnant. No rash noted on exam, only redness from excoriation. No superimposed infections noted. Instructed to treat symptomatically with antihistamines or topical creams and to follow-up with her primary care provider.  Final Clinical Impressions(s) / ED Diagnoses   Final diagnoses:  Pruritus   Disposition: Discharge  Condition: Good  I have discussed the results, Dx and Tx plan with the patient who expressed understanding and agree(s) with the plan. Discharge instructions discussed at great length. The patient was given strict return precautions who verbalized understanding of the instructions. No further questions at time of discharge.    Discharge Medication List as of 09/26/2015  9:21 AM       Follow Up: Everrett Coombe, DO [address] [place] [address] [phone]  Call in 3 days If symptoms do not improve or  worsen      Nira Conn, MD 09/26/15 1726

## 2017-07-24 LAB — OB RESULTS CONSOLE RUBELLA ANTIBODY, IGM: Rubella: IMMUNE

## 2017-07-24 LAB — OB RESULTS CONSOLE HEPATITIS B SURFACE ANTIGEN: Hepatitis B Surface Ag: NEGATIVE

## 2017-08-20 LAB — OB RESULTS CONSOLE GC/CHLAMYDIA
Chlamydia: NEGATIVE
Gonorrhea: NEGATIVE

## 2017-12-18 LAB — OB RESULTS CONSOLE ABO/RH: RH TYPE: POSITIVE

## 2017-12-18 LAB — OB RESULTS CONSOLE HIV ANTIBODY (ROUTINE TESTING): HIV: NONREACTIVE

## 2017-12-18 LAB — OB RESULTS CONSOLE RPR: RPR: NONREACTIVE

## 2018-01-29 NOTE — L&D Delivery Note (Signed)
Delivery Note At 1:41 PM a viable female was delivered via Vaginal, Spontaneous (Presentation: LOA ; vtx ).  APGAR: 8, 9; weight pending.   Placenta status: delivered spontaneously.  Cord:  with the following complications: none .   Anesthesia:  1% lidocaine block Episiotomy: None Lacerations: Left Upper Labial laceration Suture Repair: 3.0 vicryl rapide Est. Blood Loss (mL):   Mom to postpartum.  Baby to Couplet care / Skin to Skin.  Oliver Pila 03/02/2018, 2:25 PM

## 2018-02-11 LAB — OB RESULTS CONSOLE GBS: GBS: POSITIVE

## 2018-02-21 ENCOUNTER — Telehealth (HOSPITAL_COMMUNITY): Payer: Self-pay | Admitting: *Deleted

## 2018-02-21 ENCOUNTER — Encounter (HOSPITAL_COMMUNITY): Payer: Self-pay | Admitting: *Deleted

## 2018-02-21 NOTE — Telephone Encounter (Signed)
Preadmission screen  

## 2018-03-01 NOTE — H&P (Signed)
Kayla Moon is a 28 y.o. female G3P0201 at 66 0/7 weeks (EDD 03/09/18 by 7 week [place]) presenting for IOL at term with prior IUFD in third trimester.  Prenatal care significant for the h/o IUFD of unknown etiology, have been doing weekly surveillance since 30 weeks. Pt also with a PTD and was offered and received Makena weekly from 16 to 34 weeks and then declined any further.  She is GBS positive.  OB History    Gravida  3   Para  2   Term  0   Preterm  2   AB  0   Living  1     SAB  0   TAB  0   Ectopic  0   Multiple  0   Live Births  1          Past Medical History:  Diagnosis Date  . History of stillbirth 2013  . Medical history non-contributory   . No pertinent past medical history    Past Surgical History:  Procedure Laterality Date  . WISDOM TOOTH EXTRACTION     Family History: family history includes Cancer in her maternal grandfather. Social History:  reports that she has never smoked. She has never used smokeless tobacco. She reports that she does not drink alcohol or use drugs.     Maternal Diabetes: No Genetic Screening: Normal Maternal Ultrasounds/Referrals: Normal Fetal Ultrasounds or other Referrals:  None Maternal Substance Abuse:  No Significant Maternal Medications:  None Significant Maternal Lab Results:  Lab values include: Group B Strep positive Other Comments:  None  Review of Systems  Eyes: Negative for blurred vision and double vision.  Gastrointestinal: Negative for abdominal pain.   Maternal Medical History:  Contractions: Frequency: irregular.   Perceived severity is mild.    Fetal activity: Perceived fetal activity is normal.    Prenatal complications: No bleeding.   H/o third trimester IUFD H/o PTD  Prenatal Complications - Diabetes: none.      There were no vitals taken for this visit. Maternal Exam:  Uterine Assessment: Contraction strength is mild.  Contraction frequency is irregular.   Abdomen: Patient  reports no abdominal tenderness. Fetal presentation: vertex  Introitus: Normal vulva. Normal vagina.    Physical Exam  Constitutional: She appears well-developed.  Cardiovascular: Normal rate.  Respiratory: Effort normal.  GI: Bowel sounds are normal.  Genitourinary:    Vulva and vagina normal.   Psychiatric: She has a normal mood and affect.    Prenatal labs: ABO, Rh: O/Positive/-- (11/20 0000) Antibody:  negative Rubella:  immune RPR: Nonreactive (11/20 0000)  HBsAg:   neg HIV: Non-reactive (11/20 0000)  GBS: Positive (01/14 0000)  One hour GCT 140 Three hour WNL First trimester screen and AFP negative  Assessment/Plan: Plan AROM and pitocin after PCN on board for +GBS  Oliver Pila 03/01/2018, 11:38 AM

## 2018-03-02 ENCOUNTER — Encounter (HOSPITAL_COMMUNITY): Payer: Self-pay

## 2018-03-02 ENCOUNTER — Inpatient Hospital Stay (HOSPITAL_COMMUNITY)
Admission: RE | Admit: 2018-03-02 | Discharge: 2018-03-04 | DRG: 807 | Disposition: A | Discharge status: Home / Self Care | Payer: Medicaid Other | Attending: Obstetrics and Gynecology | Admitting: Obstetrics and Gynecology

## 2018-03-02 DIAGNOSIS — Z3A39 39 weeks gestation of pregnancy: Secondary | ICD-10-CM

## 2018-03-02 DIAGNOSIS — O99824 Streptococcus B carrier state complicating childbirth: Principal | ICD-10-CM | POA: Diagnosis present

## 2018-03-02 DIAGNOSIS — Z3483 Encounter for supervision of other normal pregnancy, third trimester: Secondary | ICD-10-CM | POA: Diagnosis present

## 2018-03-02 LAB — TYPE AND SCREEN
ABO/RH(D): O POS
ANTIBODY SCREEN: NEGATIVE

## 2018-03-02 LAB — CBC
HCT: 38.3 % (ref 36.0–46.0)
Hemoglobin: 12.2 g/dL (ref 12.0–15.0)
MCH: 27.9 pg (ref 26.0–34.0)
MCHC: 31.9 g/dL (ref 30.0–36.0)
MCV: 87.6 fL (ref 80.0–100.0)
Platelets: 117 10*3/uL — ABNORMAL LOW (ref 150–400)
RBC: 4.37 MIL/uL (ref 3.87–5.11)
RDW: 14.5 % (ref 11.5–15.5)
WBC: 10.3 10*3/uL (ref 4.0–10.5)
nRBC: 0 % (ref 0.0–0.2)

## 2018-03-02 MED ORDER — PRENATAL MULTIVITAMIN CH
1.0000 | ORAL_TABLET | Freq: Every day | ORAL | Status: DC
  Administered 2018-03-03 – 2018-03-04 (×2): 1 via ORAL
  Filled 2018-03-02 (×2): qty 1

## 2018-03-02 MED ORDER — OXYCODONE-ACETAMINOPHEN 5-325 MG PO TABS: 2.0000 | ORAL_TABLET | ORAL | Status: DC | PRN

## 2018-03-02 MED ORDER — TETANUS-DIPHTH-ACELL PERTUSSIS 5-2.5-18.5 LF-MCG/0.5 IM SUSP: 0.5000 mL | Freq: Once | INTRAMUSCULAR | Status: DC

## 2018-03-02 MED ORDER — ZOLPIDEM TARTRATE 5 MG PO TABS: 5.0000 mg | ORAL_TABLET | Freq: Every evening | ORAL | Status: DC | PRN

## 2018-03-02 MED ORDER — FENTANYL CITRATE (PF) 100 MCG/2ML IJ SOLN
50.0000 ug | INTRAMUSCULAR | Status: DC | PRN
  Filled 2018-03-02: qty 2

## 2018-03-02 MED ORDER — ACETAMINOPHEN 325 MG PO TABS: 650.0000 mg | ORAL_TABLET | ORAL | Status: DC | PRN

## 2018-03-02 MED ORDER — OXYTOCIN BOLUS FROM INFUSION
500.0000 mL | Freq: Once | INTRAVENOUS | Status: AC
  Administered 2018-03-02: 500 mL via INTRAVENOUS

## 2018-03-02 MED ORDER — ONDANSETRON HCL 4 MG PO TABS: 4.0000 mg | ORAL_TABLET | ORAL | Status: DC | PRN

## 2018-03-02 MED ORDER — ONDANSETRON HCL 4 MG/2ML IJ SOLN: 4.0000 mg | Freq: Four times a day (QID) | INTRAMUSCULAR | Status: DC | PRN

## 2018-03-02 MED ORDER — DIBUCAINE 1 % RE OINT
1.0000 "application " | TOPICAL_OINTMENT | RECTAL | Status: DC | PRN
  Administered 2018-03-03: 1 via RECTAL
  Filled 2018-03-02 (×2): qty 28

## 2018-03-02 MED ORDER — EPHEDRINE 5 MG/ML INJ
10.0000 mg | INTRAVENOUS | Status: DC | PRN
  Filled 2018-03-02: qty 2

## 2018-03-02 MED ORDER — OXYTOCIN 40 UNITS IN NORMAL SALINE INFUSION - SIMPLE MED
1.0000 m[IU]/min | INTRAVENOUS | Status: DC
  Administered 2018-03-02: 2 m[IU]/min via INTRAVENOUS
  Filled 2018-03-02: qty 1000

## 2018-03-02 MED ORDER — SENNOSIDES-DOCUSATE SODIUM 8.6-50 MG PO TABS
2.0000 | ORAL_TABLET | ORAL | Status: DC
  Administered 2018-03-02 – 2018-03-03 (×2): 2 via ORAL
  Filled 2018-03-02 (×2): qty 2

## 2018-03-02 MED ORDER — IBUPROFEN 600 MG PO TABS
600.0000 mg | ORAL_TABLET | Freq: Four times a day (QID) | ORAL | Status: DC
  Administered 2018-03-02 – 2018-03-04 (×7): 600 mg via ORAL
  Filled 2018-03-02 (×8): qty 1

## 2018-03-02 MED ORDER — BENZOCAINE-MENTHOL 20-0.5 % EX AERO
1.0000 "application " | INHALATION_SPRAY | CUTANEOUS | Status: DC | PRN
  Administered 2018-03-02: 1 via TOPICAL
  Filled 2018-03-02 (×2): qty 56

## 2018-03-02 MED ORDER — PHENYLEPHRINE 40 MCG/ML (10ML) SYRINGE FOR IV PUSH (FOR BLOOD PRESSURE SUPPORT)
80.0000 ug | PREFILLED_SYRINGE | INTRAVENOUS | Status: DC | PRN
  Filled 2018-03-02: qty 10

## 2018-03-02 MED ORDER — LACTATED RINGERS IV SOLN
INTRAVENOUS | Status: DC
  Administered 2018-03-02: 08:00:00 via INTRAVENOUS

## 2018-03-02 MED ORDER — OXYCODONE-ACETAMINOPHEN 5-325 MG PO TABS: 1.0000 | ORAL_TABLET | ORAL | Status: DC | PRN

## 2018-03-02 MED ORDER — DIPHENHYDRAMINE HCL 50 MG/ML IJ SOLN: 12.5000 mg | INTRAMUSCULAR | Status: DC | PRN

## 2018-03-02 MED ORDER — FENTANYL 2.5 MCG/ML BUPIVACAINE 1/10 % EPIDURAL INFUSION (WH - ANES): 14.0000 mL/h | INTRAMUSCULAR | Status: DC | PRN

## 2018-03-02 MED ORDER — TERBUTALINE SULFATE 1 MG/ML IJ SOLN
0.2500 mg | Freq: Once | INTRAMUSCULAR | Status: DC | PRN
  Filled 2018-03-02: qty 1

## 2018-03-02 MED ORDER — FENTANYL CITRATE (PF) 100 MCG/2ML IJ SOLN: 100.0000 ug | INTRAMUSCULAR | Status: DC | PRN

## 2018-03-02 MED ORDER — OXYCODONE HCL 5 MG PO TABS
10.0000 mg | ORAL_TABLET | ORAL | Status: DC | PRN
  Administered 2018-03-02 (×2): 10 mg via ORAL
  Filled 2018-03-02 (×2): qty 2

## 2018-03-02 MED ORDER — LIDOCAINE HCL (PF) 1 % IJ SOLN
30.0000 mL | INTRAMUSCULAR | Status: AC | PRN
  Administered 2018-03-02: 30 mL via SUBCUTANEOUS
  Filled 2018-03-02: qty 30

## 2018-03-02 MED ORDER — SIMETHICONE 80 MG PO CHEW: 80.0000 mg | CHEWABLE_TABLET | ORAL | Status: DC | PRN

## 2018-03-02 MED ORDER — PENICILLIN G 3 MILLION UNITS IVPB - SIMPLE MED
3.0000 10*6.[IU] | INTRAVENOUS | Status: DC
  Administered 2018-03-02: 3 10*6.[IU] via INTRAVENOUS
  Filled 2018-03-02 (×4): qty 100

## 2018-03-02 MED ORDER — LACTATED RINGERS IV SOLN: 500.0000 mL | Freq: Once | INTRAVENOUS | Status: DC

## 2018-03-02 MED ORDER — DIPHENHYDRAMINE HCL 25 MG PO CAPS: 25.0000 mg | ORAL_CAPSULE | Freq: Four times a day (QID) | ORAL | Status: DC | PRN

## 2018-03-02 MED ORDER — LACTATED RINGERS IV SOLN: 500.0000 mL | INTRAVENOUS | Status: DC | PRN

## 2018-03-02 MED ORDER — COCONUT OIL OIL
1.0000 "application " | TOPICAL_OIL | Status: DC | PRN
  Administered 2018-03-03: 1 via TOPICAL
  Filled 2018-03-02: qty 120

## 2018-03-02 MED ORDER — OXYCODONE HCL 5 MG PO TABS
5.0000 mg | ORAL_TABLET | ORAL | Status: DC | PRN
  Administered 2018-03-03 (×2): 5 mg via ORAL
  Filled 2018-03-02 (×2): qty 1

## 2018-03-02 MED ORDER — SODIUM CHLORIDE 0.9 % IV SOLN
5.0000 10*6.[IU] | Freq: Once | INTRAVENOUS | Status: AC
  Administered 2018-03-02: 5 10*6.[IU] via INTRAVENOUS
  Filled 2018-03-02: qty 5

## 2018-03-02 MED ORDER — ONDANSETRON HCL 4 MG/2ML IJ SOLN: 4.0000 mg | INTRAMUSCULAR | Status: DC | PRN

## 2018-03-02 MED ORDER — SOD CITRATE-CITRIC ACID 500-334 MG/5ML PO SOLN: 30.0000 mL | ORAL | Status: DC | PRN

## 2018-03-02 MED ORDER — WITCH HAZEL-GLYCERIN EX PADS
1.0000 "application " | MEDICATED_PAD | CUTANEOUS | Status: DC | PRN
  Administered 2018-03-02: 1 via TOPICAL

## 2018-03-02 MED ORDER — ACETAMINOPHEN 325 MG PO TABS
650.0000 mg | ORAL_TABLET | ORAL | Status: DC | PRN
  Administered 2018-03-03 (×2): 650 mg via ORAL
  Filled 2018-03-02 (×2): qty 2

## 2018-03-02 MED ORDER — OXYTOCIN 40 UNITS IN NORMAL SALINE INFUSION - SIMPLE MED: 2.5000 [IU]/h | INTRAVENOUS | Status: DC

## 2018-03-02 NOTE — Progress Notes (Signed)
Patient ID: Kayla Moon, female   DOB: 18-Apr-1990, 28 y.o.   MRN: [id number] Pt feeling minimal contractions  afeb vss FHR Category 1  Cervix 4-5/80/-2 per RN exam to start pitocin  Pitocin per protocol PCN on board at 0830, so given advanced dilation will AROM after on board 4 hours

## 2018-03-02 NOTE — Anesthesia Pain Management Evaluation Note (Signed)
  CRNA Pain Management Visit Note  Patient: Kayla Moon, 28 y.o., female  "Hello I am a member of the anesthesia team at [facility]. We have an anesthesia team available at all times to provide care throughout the hospital, including epidural management and anesthesia for C-section. I don't know your plan for the delivery whether it a natural birth, water birth, IV sedation, nitrous supplementation, doula or epidural, but we want to meet your pain goals."   1.Was your pain managed to your expectations on prior hospitalizations?   No prior hospitalizations  2.What is your expectation for pain management during this hospitalization?     Labor support without medications  3.How can we help you reach that goal?natural   Record the patient's initial score and the patient's pain goal.   Pain: 2  Pain Goal: 10 [facility] wants you to be able to say your pain was always managed very well.  Rica Records 03/02/2018

## 2018-03-02 NOTE — Lactation Note (Signed)
This note was copied from a baby's chart. Lactation Consultation Note  Patient Name: Girl Khamia Melloy PHXTA'V Date: 03/02/2018 Reason for consult: Initial assessment;1st time breastfeeding;Term   9 hours old FT female who is being exclusively BF by her mother, she's a P3 but not experienced BF. Mom had an IUFD at 35 weeks with her first baby and never BF her second one. She also chose to do breast/formula as her feeding choice upon admission so she may start supplementing at some point. When reviewing hand expression with mom, she was able to get two droplets of colostrum out of both nipples, rubbed them on baby's mouth and also on her nipples, reviewed prevention and treatment for sore nipples. Mom has a hand pump at home.  Offered assistance with latch but mom politely declined stating baby already fed, she was asleep on her bassinet. Asked mom to call for assistance when needed. Discussed normal newborn behavior, feeding cues and cluster feeding.   Feeding plan:  1. Encouraged mom to feed baby STS 8-12 times/24 hours or sooner if feeding cues are present 2. Hand expression and spoon feeding were also encouraged  BF brochure, BF resources and feeding diary were also reviewed. Parents reported all questions and concerns were answered, they're both aware of LC services and will call PRN.  Maternal Data Formula Feeding for Exclusion: Yes Reason for exclusion: Mother's choice to formula and breast feed on admission Has patient been taught Hand Expression?: Yes Does the patient have breastfeeding experience prior to this delivery?: No(she had an IUFD with her 1st pregnancy and never BF her second child)  Feeding    Interventions Interventions: Breast feeding basics reviewed;Breast massage;Breast compression;Hand express  Lactation Tools Discussed/Used WIC Program: No   Consult Status Consult Status: Follow-up Date: 03/03/18 Follow-up type: In-patient    Rande Roylance Venetia Constable 03/02/2018, 11:00 PM

## 2018-03-02 NOTE — Progress Notes (Signed)
Patient ID: Kayla Moon, female   DOB: Dec 19, 1990, 28 y.o.   MRN: [id number] Pt still reporting contractions as mild  afeb VSS FHR category 1  Cervix 4-5cm/90/-1  AROM clear Follow progress PCN dose #2 started

## 2018-03-03 LAB — CBC
HCT: 32 % — ABNORMAL LOW (ref 36.0–46.0)
Hemoglobin: 10.4 g/dL — ABNORMAL LOW (ref 12.0–15.0)
MCH: 28 pg (ref 26.0–34.0)
MCHC: 32.5 g/dL (ref 30.0–36.0)
MCV: 86.3 fL (ref 80.0–100.0)
Platelets: 109 10*3/uL — ABNORMAL LOW (ref 150–400)
RBC: 3.71 MIL/uL — ABNORMAL LOW (ref 3.87–5.11)
RDW: 14.3 % (ref 11.5–15.5)
WBC: 15.3 10*3/uL — ABNORMAL HIGH (ref 4.0–10.5)
nRBC: 0 % (ref 0.0–0.2)

## 2018-03-03 LAB — RPR: RPR Ser Ql: NONREACTIVE

## 2018-03-03 NOTE — Progress Notes (Signed)
Patient ID: Kayla Moon, female   DOB: 11/29/1990, 28 y.o.   MRN: [id number] Pt with no complaints. Doing well VSS Routine pp care

## 2018-03-03 NOTE — Progress Notes (Signed)
MOB was referred for history of depression/anxiety. * Referral screened out by Clinical Social Worker because none of the following criteria appear to apply: ~ History of anxiety/depression during this pregnancy, or of post-partum depression following prior delivery. ~ Diagnosis of anxiety and/or depression within last 3 years. No concerns noted in OB record. OR * MOB's symptoms currently being treated with medication and/or therapy.  Please contact the Clinical Social Worker if needs arise, by MOB request, or if MOB scores greater than 9/yes to question 10 on Edinburgh Postpartum Depression Screen.  Aryah Doering Boyd-Gilyard, MSW, LCSW Clinical Social Work [phone]   

## 2018-03-03 NOTE — Lactation Note (Signed)
This note was copied from a baby's chart. Lactation Consultation Note  Patient Name: Kayla Moon PrestoRenee Paulo ZOXWR'UToday's Date: 03/03/2018 Reason for consult: Follow-up assessment;Infant weight loss;Term;1st time breastfeeding;Nipple pain/trauma Baby is 21 hours old  Baby awake , showing feeding cues and wet. Diaper change and then assist to latch.  Mom mentioned she is sore / the right has a positional strip.  LC assisted to latch on the right breast / football / and worked on depth/ swallows noted / increased with breast  Compressions. Per mom the positioning has helped and support.  Sore nipple tx reviewed and recommended using the comfort gels after feedings both breast with a bra/ alternating  With breast shells with coconut oil. Lc also  instructed on the use of hand pump / #24 F good fit. Recommended after breast massage / hand express/ pre pump to prime the milk ducts and make the nipple / areola complex more elastic and showed mom the reverse pressure and how compressible the areola has to be for a deep latch.  LC mentioned to mom what ever milk she may pump off it can be spoon fed back to baby.    Maternal Data Has patient been taught Hand Expression?: Yes  Feeding Feeding Type: Breast Fed  LATCH Score Latch: Grasps breast easily, tongue down, lips flanged, rhythmical sucking.  Audible Swallowing: Spontaneous and intermittent  Type of Nipple: Everted at rest and after stimulation  Comfort (Breast/Nipple): Filling, red/small blisters or bruises, mild/mod discomfort  Hold (Positioning): Assistance needed to correctly position infant at breast and maintain latch.  LATCH Score: 8  Interventions Interventions: Breast feeding basics reviewed;Assisted with latch;Skin to skin;Breast massage;Hand express;Breast compression;Adjust position;Support pillows;Position options;Shells;Comfort gels;Coconut oil;Hand pump  Lactation Tools Discussed/Used Tools: Shells;Pump;Comfort gels;Coconut  oil Shell Type: Inverted Breast pump type: Manual Pump Review: Setup, frequency, and cleaning Initiated by:: MAI  Date initiated:: 03/03/18   Consult Status Consult Status: Follow-up Date: 03/04/18 Follow-up type: In-patient    Matilde SprangMargaret Ann Anastasha Ortez 03/03/2018, 11:38 AM

## 2018-03-03 NOTE — Progress Notes (Addendum)
Patient ID: Kayla Moon, female   DOB: 07/30/1990, 28 y.o.   MRN: [id number] Pt doing well. Pain mild - tolerated with meds. Lochia mild. Bonding well with baby. She denies fever, chills, SOB or CP. Ambulating well VSS ABD - FF and below umbilicus EXT - no homans   15.3>10.4<109  A/P: PPD#1 s/p svd - stable        Routine pp care

## 2018-03-04 MED ORDER — OXYCODONE HCL 5 MG PO TABS: 5.0000 mg | ORAL_TABLET | Freq: Four times a day (QID) | ORAL | 0 refills | Status: AC | PRN

## 2018-03-04 MED ORDER — IBUPROFEN 600 MG PO TABS: 600.0000 mg | ORAL_TABLET | Freq: Four times a day (QID) | ORAL | 0 refills | Status: AC

## 2018-03-04 NOTE — Discharge Instructions (Signed)
As per discharge pamphlet °

## 2018-03-04 NOTE — Discharge Summary (Signed)
OB Discharge Summary     Patient Name: Kayla Moon DOB: [birth year] MRN: [id number]  Date of admission: 03/02/2018 Delivering MD: Huel Cote   Date of discharge: 03/04/2018  Admitting diagnosis: Induction Intrauterine pregnancy: [redacted]w[redacted]d     Secondary diagnosis:  Active Problems:   NSVD (normal spontaneous vaginal delivery)   Indication for care in labor and delivery, antepartum      Discharge diagnosis: Term Pregnancy Delivered                                   Hospital course:  Induction of Labor With Vaginal Delivery   28 y.o. yo G3T5176 at [redacted]w[redacted]d was admitted to the hospital 03/02/2018 for induction of labor.  Indication for induction: Favorable cervix at term.  Patient had an uncomplicated labor course as follows: Membrane Rupture Time/Date: 12:19 PM ,03/02/2018   Intrapartum Procedures: Episiotomy: None [1]                                         Lacerations:  Labial [10]  Patient had delivery of a Viable infant.  Information for the patient's newborn:  Kayla Moon [[id number]]  Delivery Method: Vaginal, Spontaneous(Filed from Delivery Summary)   03/02/2018  Details of delivery can be found in separate delivery note.  Patient had a routine postpartum course. Patient is discharged home 03/04/18.  Physical exam  Vitals:   03/03/18 0417 03/03/18 1454 03/03/18 2240 03/04/18 0635  BP: 110/60 125/76 (!) 112/49 113/61  Pulse: 79 93  75  Resp: 16 18 16 18   Temp: 97.6 F (36.4 C) 97.9 F (36.6 C) 98 F (36.7 C) 97.9 F (36.6 C)  TempSrc: Oral Oral Oral Oral  SpO2: 100% 100%  100%  Weight:      Height:       General: alert Lochia: appropriate Uterine Fundus: firm  Labs: Lab Results  Component Value Date   WBC 15.3 (H) 03/03/2018   HGB 10.4 (L) 03/03/2018   HCT 32.0 (L) 03/03/2018   MCV 86.3 03/03/2018   PLT 109 (L) 03/03/2018   CMP Latest Ref Rng & Units 07/28/2015  Glucose 65 - 99 mg/dL 79  BUN 6 - 20 mg/dL 12  Creatinine 2.69 - 4.85 mg/dL 4.62   Sodium 703 - 500 mmol/L 138  Potassium 3.5 - 5.1 mmol/L 3.6  Chloride 101 - 111 mmol/L 104  CO2 22 - 32 mmol/L 25  Calcium 8.9 - 10.3 mg/dL 9.3(G)  Total Protein 6.5 - 8.1 g/dL 6.8  Total Bilirubin 0.3 - 1.2 mg/dL 0.3  Alkaline Phos 38 - 126 U/L 41  AST 15 - 41 U/L 20  ALT 14 - 54 U/L 11(L)    Discharge instruction: per After Visit Summary and "Baby and Me Booklet".  After visit meds:  Allergies as of 03/04/2018   No Known Allergies     Medication List    TAKE these medications   ibuprofen 600 MG tablet Commonly known as:  ADVIL,MOTRIN Take 1 tablet (600 mg total) by mouth every 6 (six) hours.   oxyCODONE 5 MG immediate release tablet Commonly known as:  Oxy IR/ROXICODONE Take 1 tablet (5 mg total) by mouth every 6 (six) hours as needed for severe pain.   prenatal multivitamin Tabs tablet Take 1 tablet by mouth daily at 12 noon.  Diet: routine diet  Activity: Advance as tolerated. Pelvic rest for 6 weeks.   Outpatient follow up:6 weeks  Newborn Data: Live born female  Birth Weight: 8 lb 2.2 oz (3690 g) APGAR: 8, 9  Newborn Delivery   Birth date/time:  03/02/2018 13:41:00 Delivery type:  Vaginal, Spontaneous     Baby Feeding: Breast Disposition:home with mother   03/04/2018 Kayla Nieceodd D Kurk Corniel, MD

## 2018-03-04 NOTE — Progress Notes (Signed)
PPD #2 Doing well Afeb, VSS D/c home 

## 2018-03-04 NOTE — Lactation Note (Addendum)
This note was copied from a baby's chart. Lactation Consultation Note  Patient Name: Kayla Moon JYNWG'N Date: 03/04/2018   Mom says that her breasts are sore, but reports that her nipples are rounded when infant releases latch. Mom already has Comfort Gels, shells, nursing pads, etc. Infant currently sleeping on Mom's chest. Infant has had 8 stools & 5 voids since birth.  Mom has my # to call to assess next feeding.  Lurline Hare [facility] 03/04/2018, 11:41 AM

## 2018-11-05 ENCOUNTER — Other Ambulatory Visit: Payer: Self-pay

## 2018-11-05 DIAGNOSIS — Z20822 Contact with and (suspected) exposure to covid-19: Secondary | ICD-10-CM

## 2018-11-07 LAB — NOVEL CORONAVIRUS, NAA: SARS-CoV-2, NAA: NOT DETECTED

## 2018-12-05 ENCOUNTER — Other Ambulatory Visit: Payer: Self-pay

## 2018-12-05 DIAGNOSIS — Z20822 Contact with and (suspected) exposure to covid-19: Secondary | ICD-10-CM

## 2018-12-06 LAB — NOVEL CORONAVIRUS, NAA: SARS-CoV-2, NAA: NOT DETECTED

## 2018-12-17 ENCOUNTER — Other Ambulatory Visit: Payer: Self-pay

## 2018-12-17 DIAGNOSIS — Z20822 Contact with and (suspected) exposure to covid-19: Secondary | ICD-10-CM

## 2018-12-18 LAB — NOVEL CORONAVIRUS, NAA: SARS-CoV-2, NAA: NOT DETECTED

## 2019-03-30 ENCOUNTER — Ambulatory Visit: Payer: Medicaid Other | Attending: Internal Medicine

## 2019-03-30 DIAGNOSIS — Z20822 Contact with and (suspected) exposure to covid-19: Secondary | ICD-10-CM

## 2019-03-31 LAB — NOVEL CORONAVIRUS, NAA: SARS-CoV-2, NAA: NOT DETECTED

## 2021-04-25 ENCOUNTER — Other Ambulatory Visit: Payer: Self-pay

## 2021-04-25 ENCOUNTER — Emergency Department (HOSPITAL_BASED_OUTPATIENT_CLINIC_OR_DEPARTMENT_OTHER): Payer: 59

## 2021-04-25 ENCOUNTER — Encounter (HOSPITAL_BASED_OUTPATIENT_CLINIC_OR_DEPARTMENT_OTHER): Payer: Self-pay | Admitting: *Deleted

## 2021-04-25 ENCOUNTER — Emergency Department (HOSPITAL_BASED_OUTPATIENT_CLINIC_OR_DEPARTMENT_OTHER)
Admission: EM | Admit: 2021-04-25 | Discharge: 2021-04-25 | Disposition: A | Discharge status: Home / Self Care | Payer: 59 | Attending: Emergency Medicine | Admitting: Emergency Medicine

## 2021-04-25 DIAGNOSIS — M25512 Pain in left shoulder: Secondary | ICD-10-CM | POA: Diagnosis present

## 2021-04-25 DIAGNOSIS — M7522 Bicipital tendinitis, left shoulder: Secondary | ICD-10-CM | POA: Diagnosis not present

## 2021-04-25 MED ORDER — KETOROLAC TROMETHAMINE 15 MG/ML IJ SOLN
15.0000 mg | Freq: Once | INTRAMUSCULAR | Status: DC
  Filled 2021-04-25: qty 1

## 2021-04-25 MED ORDER — KETOROLAC TROMETHAMINE 15 MG/ML IJ SOLN
15.0000 mg | Freq: Once | INTRAMUSCULAR | Status: AC
  Administered 2021-04-25: 15 mg via INTRAMUSCULAR

## 2021-04-25 MED ORDER — NAPROXEN 500 MG PO TABS: 500.0000 mg | ORAL_TABLET | Freq: Two times a day (BID) | ORAL | 0 refills | Status: AC

## 2021-04-25 MED ORDER — CYCLOBENZAPRINE HCL 10 MG PO TABS: 10.0000 mg | ORAL_TABLET | Freq: Two times a day (BID) | ORAL | 0 refills | Status: AC | PRN

## 2021-04-25 NOTE — ED Provider Notes (Signed)
?[facility] EMERGENCY DEPARTMENT ?Provider Note ? ? ?CSN: [id number] ?Arrival date & time: 04/25/21  1033 ? ?  ? ?History ?Chief Complaint  ?Patient presents with  ? Shoulder Pain  ? ? ?Kayla Moon is a 31 y.o. female who presents to the emergency department with left shoulder pain that started on Friday and has been progressively worsening since then.  Patient complains of shoulder pain primarily to the left anterior shoulder.  It is worse with movement and she rates it severe in severity.  She denies any weakness or numbness to the arm. She denies any trauma or injury to the arm ? ? ?Shoulder Pain ? ?  ? ?Home Medications ?Prior to Admission medications   ?Medication Sig Start Date End Date Taking? Authorizing Provider  ?cyclobenzaprine (FLEXERIL) 10 MG tablet Take 1 tablet (10 mg total) by mouth 2 (two) times daily as needed for muscle spasms. 04/25/21  Yes Meredeth Ide, Josmar Messimer M, PA-C  ?naproxen (NAPROSYN) 500 MG tablet Take 1 tablet (500 mg total) by mouth 2 (two) times daily. 04/25/21  Yes Meredeth Ide, Chelby Salata M, PA-C  ?ibuprofen (ADVIL,MOTRIN) 600 MG tablet Take 1 tablet (600 mg total) by mouth every 6 (six) hours. 03/04/18   Meisinger, Tawanna Cooler, MD  ?oxyCODONE (OXY IR/ROXICODONE) 5 MG immediate release tablet Take 1 tablet (5 mg total) by mouth every 6 (six) hours as needed for severe pain. 03/04/18   Meisinger, Tawanna Cooler, MD  ?Prenatal Vit-Fe Fumarate-FA (PRENATAL MULTIVITAMIN) TABS tablet Take 1 tablet by mouth daily at 12 noon.    [provider]  ?   ? ?Allergies    ?Patient has no known allergies.   ? ?Review of Systems   ?Review of Systems  ?All other systems reviewed and are negative. ? ?Physical Exam ?Updated Vital Signs ?BP 116/77 (BP Location: Right Arm)   Pulse 70   Temp 98.4 ?F (36.9 ?C) (Oral)   Resp 18   Ht 5\' 5"  (1.651 m)   Wt 69.4 kg   SpO2 100%   BMI 25.46 kg/m?  ?Physical Exam ?Vitals and nursing note reviewed.  ?Constitutional:   ?   Appearance: Normal appearance.  ?HENT:  ?   Head:  Normocephalic and atraumatic.  ?Eyes:  ?   General:     ?   Right eye: No discharge.     ?   Left eye: No discharge.  ?   Conjunctiva/sclera: Conjunctivae normal.  ?Pulmonary:  ?   Effort: Pulmonary effort is normal.  ?Musculoskeletal:  ?   Comments: There is point tenderness along the left proximal bicep tendon along the bicipital groove.  There is no tenderness to the posterior aspect of the shoulder or laterally.  2+ radial pulse on the left.  Good sensation distally at the wrist and hand.  Limited range of motion in the left shoulder secondary to pain.  ?Skin: ?   General: Skin is warm and dry.  ?   Findings: No rash.  ?Neurological:  ?   General: No focal deficit present.  ?   Mental Status: She is alert.  ?Psychiatric:     ?   Mood and Affect: Mood normal.     ?   Behavior: Behavior normal.  ? ? ?ED Results / Procedures / Treatments   ?Labs ?(all labs ordered are listed, but only abnormal results are displayed) ?Labs Reviewed - No data to display ? ?EKG ?None ? ?Radiology ?DG Shoulder Left ? ?Result Date: 04/25/2021 ?CLINICAL DATA:  Left shoulder pain. EXAM: LEFT  SHOULDER - 2+ VIEW COMPARISON:  None. FINDINGS: The glenohumeral and AC joints are normal. No acute bony findings or bone lesion. No abnormal soft tissue calcifications. The visualized left ribs are intact and the visualized left lung is clear. IMPRESSION: Normal left shoulder radiographs. Electronically Signed   By: Rudie Meyer M.D.   On: 04/25/2021 13:17   ? ?Procedures ?Procedures  ? ? ?Medications Ordered in ED ?Medications  ?ketorolac (TORADOL) 15 MG/ML injection 15 mg (15 mg Intramuscular Given 04/25/21 1323)  ? ? ?ED Course/ Medical Decision Making/ A&P ?  ?                        ?Medical Decision Making ?Amount and/or Complexity of Data Reviewed ?Radiology: ordered. ? ?Risk ?Prescription drug management. ? ? ?Kayla Moon is a 31 y.o. female who presents to the emergency department with left shoulder pain.  There is suspicious is likely  bicep tendinopathy.  I have a low suspicion for rotator cuff injury or impingement syndrome.  Patient has focal point tenderness over the bicep tendon along the bicipital groove.  Patient notes mild improvement with Toradol given the department.  I personally ordered and interpreted imaging of the left shoulder which was negative for any fractures or dislocations.  No evidence of bony spurs.  We will give the patient a sling for comfort, naproxen, and Flexeril to go home with.  We will also provide her with orthopedic follow-up.  Patient expressed full understanding.  Strict return precautions were discussed.  She is safe for discharge. ? ?Final Clinical Impression(s) / ED Diagnoses ?Final diagnoses:  ?Biceps tendinitis of left upper extremity  ? ? ?Rx / DC Orders ?ED Discharge Orders   ? ?      Ordered  ?  naproxen (NAPROSYN) 500 MG tablet  2 times daily       ? 04/25/21 1418  ?  cyclobenzaprine (FLEXERIL) 10 MG tablet  2 times daily PRN       ? 04/25/21 1418  ? ?  ?  ? ?  ? ? ?  ?Honor Loh [place], PA-C ?04/25/21 1421 ? ?  ?Rolan Bucco, MD ?04/25/21 1539 ? ?

## 2021-04-25 NOTE — Discharge Instructions (Addendum)
Please use ice 3 times daily on the left shoulder.  Naproxen twice daily as needed for pain.  Flexeril which is a muscle relaxer as needed for spasm.  Please follow-up with orthopedics for further evaluation. ?

## 2021-04-25 NOTE — ED Triage Notes (Signed)
Onset of left shoulder pain this past Friday, pain gradually has creased and radiates to left neck, pain also interferes with movement of left arm as well. Denies any injury or falls.  ?

## 2022-11-04 ENCOUNTER — Ambulatory Visit: Payer: Self-pay

## 2022-11-04 DIAGNOSIS — Z23 Encounter for immunization: Secondary | ICD-10-CM

## 2023-07-25 IMAGING — DX DG SHOULDER 2+V*L*
3 series · 3 of 3 positions shown · non-contrast
Comparison: None.

CLINICAL DATA: Left shoulder pain.

EXAM:
LEFT SHOULDER - 2+ VIEW

[shoulder grashey]
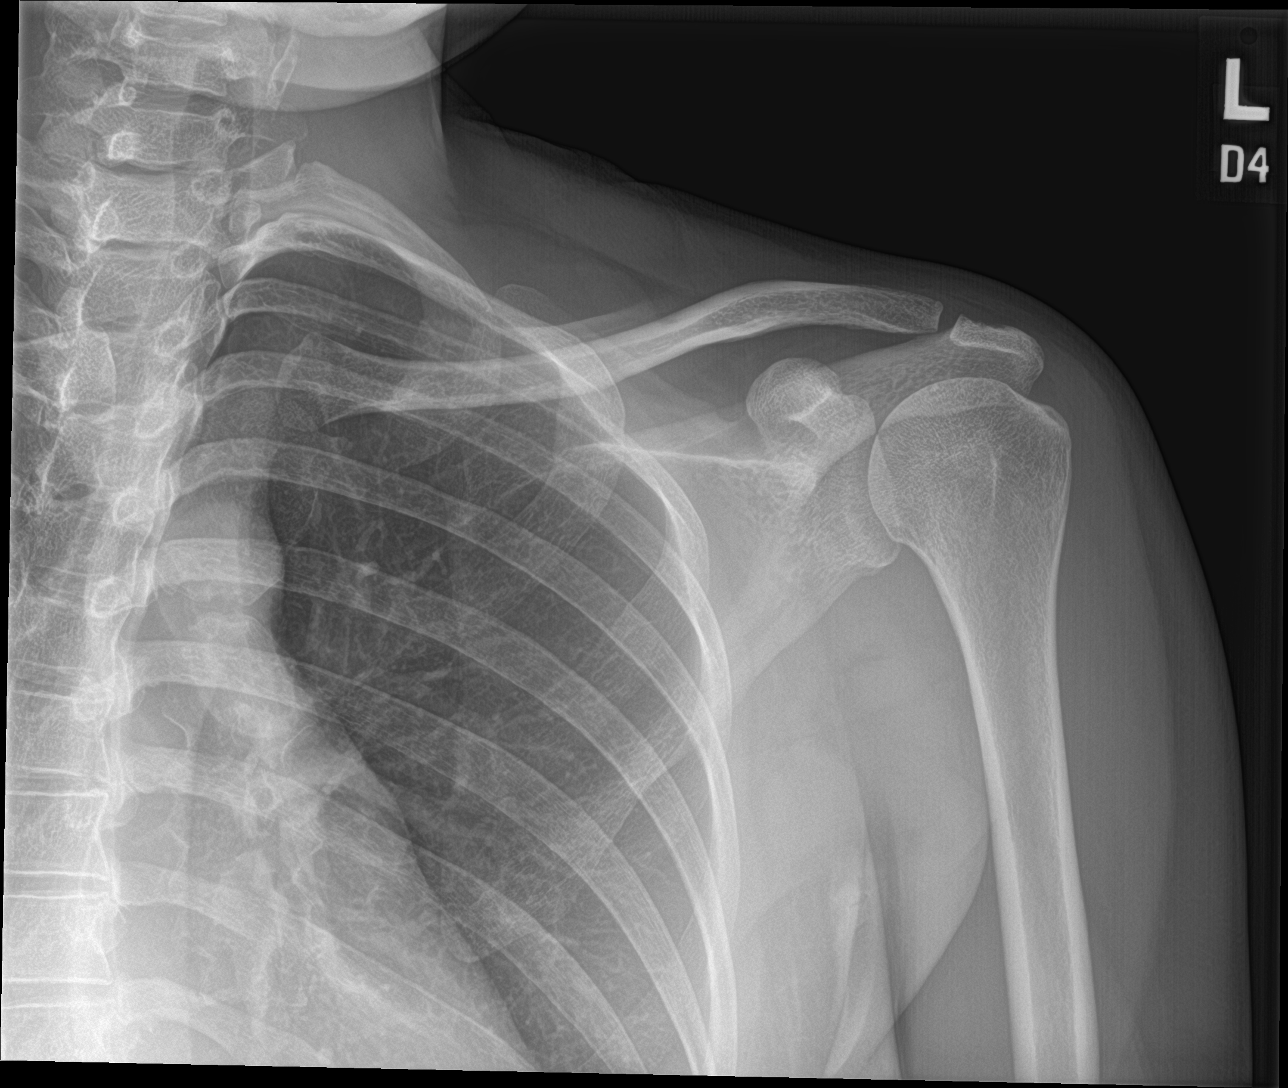

[shoulder y view]
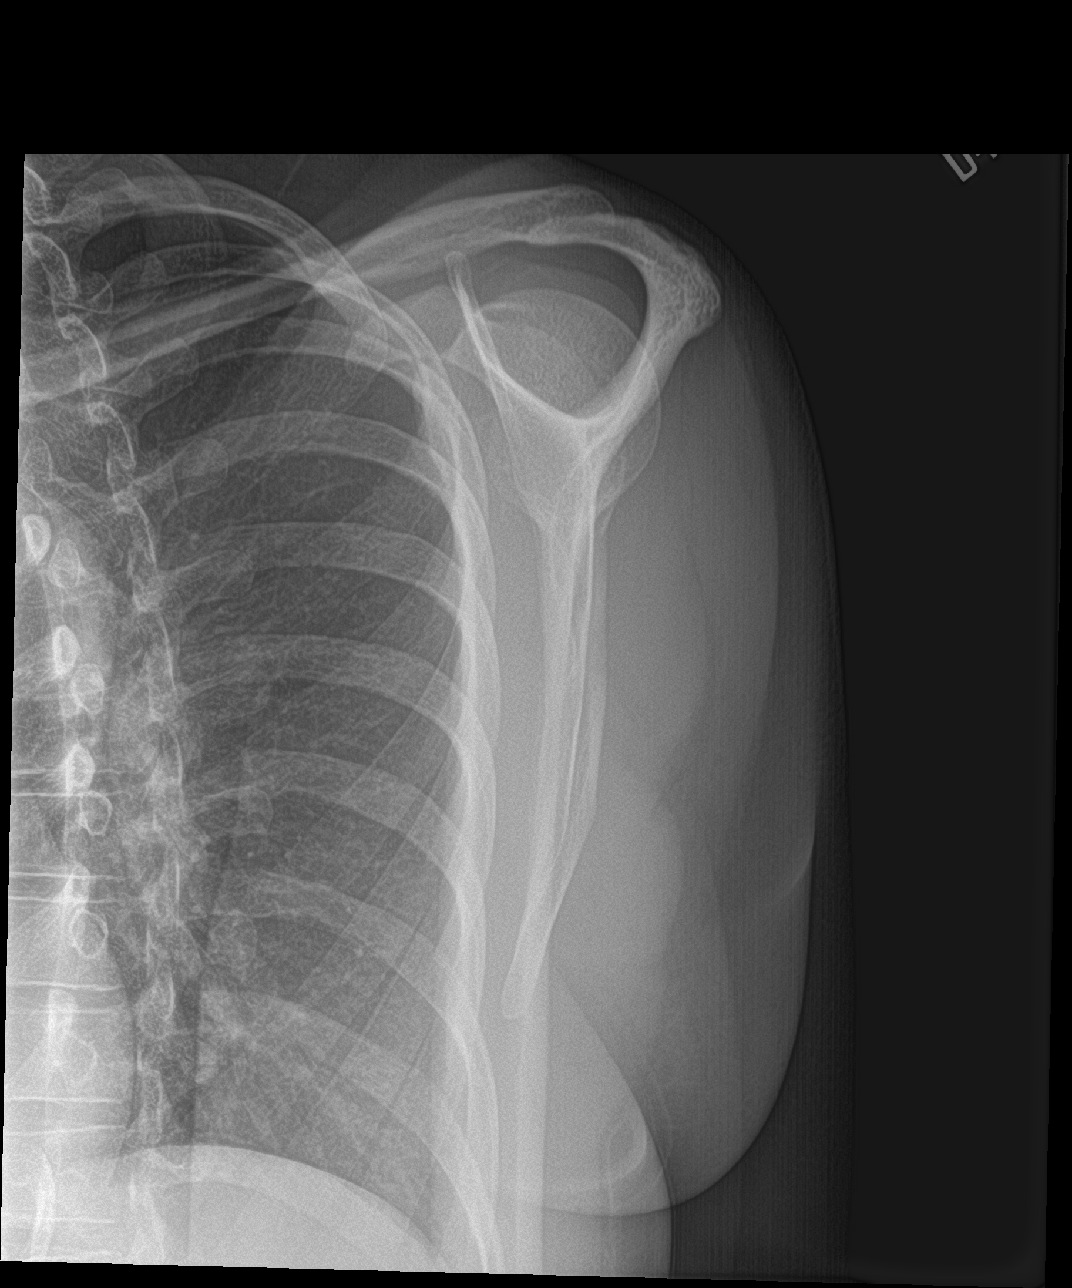

[shoulder axillary]
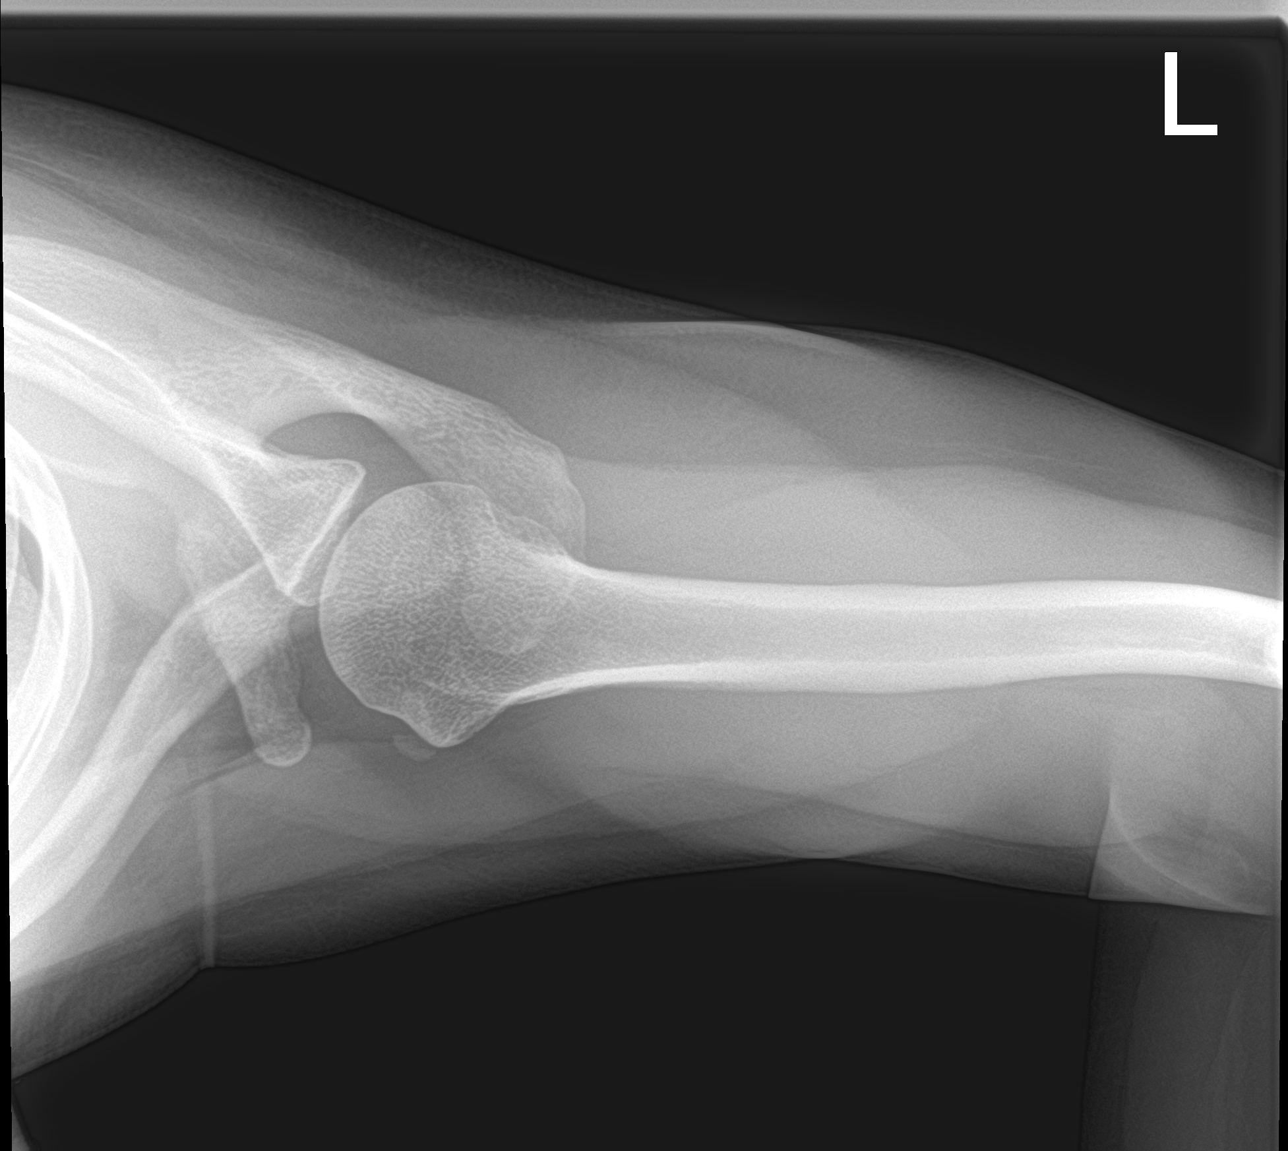

[3 of 3 positions shown; findings below may reference images not displayed]

FINDINGS: The glenohumeral and AC joints are normal. No acute bony findings or
bone lesion. No abnormal soft tissue calcifications. The visualized
left ribs are intact and the visualized left lung is clear.
IMPRESSION: Normal left shoulder radiographs.
